# Patient Record
Sex: Female | Born: 1985 | Race: Black or African American | Hispanic: No | Marital: Married
Health system: Southern US, Academic
[De-identification: ages and names within clinical notes are randomized; demographics above are authoritative.]

## PROBLEM LIST (undated history)

## (undated) ENCOUNTER — Encounter

## (undated) ENCOUNTER — Ambulatory Visit: Payer: PRIVATE HEALTH INSURANCE

## (undated) ENCOUNTER — Ambulatory Visit

## (undated) ENCOUNTER — Telehealth: Attending: Rheumatology | Primary: Rheumatology

## (undated) ENCOUNTER — Encounter: Payer: PRIVATE HEALTH INSURANCE | Attending: Dermatology | Primary: Dermatology

## (undated) ENCOUNTER — Encounter: Attending: Internal Medicine | Primary: Internal Medicine

## (undated) ENCOUNTER — Encounter: Attending: Rheumatology | Primary: Rheumatology

## (undated) ENCOUNTER — Encounter: Payer: PRIVATE HEALTH INSURANCE | Attending: Internal Medicine | Primary: Internal Medicine

## (undated) ENCOUNTER — Ambulatory Visit: Payer: PRIVATE HEALTH INSURANCE | Attending: Internal Medicine | Primary: Internal Medicine

## (undated) ENCOUNTER — Ambulatory Visit
Payer: PRIVATE HEALTH INSURANCE | Attending: Student in an Organized Health Care Education/Training Program | Primary: Student in an Organized Health Care Education/Training Program

## (undated) ENCOUNTER — Encounter: Attending: Ambulatory Care | Primary: Ambulatory Care

## (undated) ENCOUNTER — Telehealth

## (undated) ENCOUNTER — Encounter: Payer: PRIVATE HEALTH INSURANCE | Attending: Ophthalmology | Primary: Ophthalmology

## (undated) ENCOUNTER — Ambulatory Visit
Payer: BLUE CROSS/BLUE SHIELD | Attending: Student in an Organized Health Care Education/Training Program | Primary: Student in an Organized Health Care Education/Training Program

## (undated) ENCOUNTER — Encounter
Payer: PRIVATE HEALTH INSURANCE | Attending: Student in an Organized Health Care Education/Training Program | Primary: Student in an Organized Health Care Education/Training Program

## (undated) ENCOUNTER — Telehealth: Attending: Internal Medicine | Primary: Internal Medicine

## (undated) ENCOUNTER — Ambulatory Visit: Attending: Nurse Practitioner | Primary: Nurse Practitioner

## (undated) ENCOUNTER — Ambulatory Visit
Attending: Student in an Organized Health Care Education/Training Program | Primary: Student in an Organized Health Care Education/Training Program

## (undated) ENCOUNTER — Ambulatory Visit: Payer: PRIVATE HEALTH INSURANCE | Attending: Rheumatology | Primary: Rheumatology

## (undated) HISTORY — PX: TUBAL LIGATION: SHX77

---
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
## 2005-04-01 ENCOUNTER — Emergency Department: Payer: Self-pay | Admitting: Emergency Medicine

## 2006-09-30 ENCOUNTER — Emergency Department: Payer: Self-pay | Admitting: Emergency Medicine

## 2010-04-11 ENCOUNTER — Emergency Department: Payer: Self-pay | Admitting: Internal Medicine

## 2010-10-27 ENCOUNTER — Ambulatory Visit: Payer: Self-pay | Admitting: Internal Medicine

## 2011-01-08 ENCOUNTER — Ambulatory Visit: Payer: Self-pay | Admitting: Internal Medicine

## 2013-09-10 ENCOUNTER — Emergency Department: Payer: Self-pay | Admitting: Emergency Medicine

## 2013-09-10 LAB — COMPREHENSIVE METABOLIC PANEL
Albumin: 3.9 g/dL (ref 3.4–5.0)
Anion Gap: 6 — ABNORMAL LOW (ref 7–16)
BUN: 5 mg/dL — ABNORMAL LOW (ref 7–18)
Bilirubin,Total: 0.6 mg/dL (ref 0.2–1.0)
Calcium, Total: 9.2 mg/dL (ref 8.5–10.1)
Chloride: 103 mmol/L (ref 98–107)
Co2: 27 mmol/L (ref 21–32)
EGFR (African American): >60
EGFR (Non-African Amer.): >60
Glucose: 112 mg/dL — ABNORMAL HIGH (ref 65–99)
Osmolality: 270 (ref 275–301)
SGPT (ALT): 18 U/L (ref 12–78)
Sodium: 136 mmol/L (ref 136–145)
Total Protein: 7.8 g/dL (ref 6.4–8.2)

## 2013-09-10 LAB — CBC WITH DIFFERENTIAL/PLATELET
Basophil #: 0 10*3/uL (ref 0.0–0.1)
Basophil %: 0.4 %
HCT: 41.6 % (ref 35.0–47.0)
HGB: 14.1 g/dL (ref 12.0–16.0)
Lymphocyte #: 1.6 10*3/uL (ref 1.0–3.6)
Lymphocyte %: 14.7 %
MCHC: 33.9 g/dL (ref 32.0–36.0)
MCV: 89 fL (ref 80–100)
Neutrophil #: 8.6 10*3/uL — ABNORMAL HIGH (ref 1.4–6.5)
WBC: 11.1 10*3/uL — ABNORMAL HIGH (ref 3.6–11.0)

## 2013-10-15 ENCOUNTER — Emergency Department: Payer: Self-pay | Admitting: Emergency Medicine

## 2013-10-15 LAB — COMPREHENSIVE METABOLIC PANEL
Anion Gap: 14 (ref 7–16)
Bilirubin,Total: 0.5 mg/dL (ref 0.2–1.0)
Calcium, Total: 9.5 mg/dL (ref 8.5–10.1)
Co2: 18 mmol/L — ABNORMAL LOW (ref 21–32)
Creatinine: 0.55 mg/dL — ABNORMAL LOW (ref 0.60–1.30)
EGFR (Non-African Amer.): >60
Glucose: 178 mg/dL — ABNORMAL HIGH (ref 65–99)
Osmolality: 272 (ref 275–301)
Potassium: 3.2 mmol/L — ABNORMAL LOW (ref 3.5–5.1)
SGPT (ALT): 19 U/L (ref 12–78)
Sodium: 135 mmol/L — ABNORMAL LOW (ref 136–145)
Total Protein: 7.5 g/dL (ref 6.4–8.2)

## 2013-10-15 LAB — CBC WITH DIFFERENTIAL/PLATELET
Basophil #: 0.1 10*3/uL (ref 0.0–0.1)
Basophil %: 0.6 %
Eosinophil #: 0 10*3/uL (ref 0.0–0.7)
Eosinophil %: 0.3 %
HCT: 39.8 % (ref 35.0–47.0)
HGB: 13.6 g/dL (ref 12.0–16.0)
Lymphocyte %: 7.4 %
MCHC: 34.1 g/dL (ref 32.0–36.0)
MCV: 88 fL (ref 80–100)
Monocyte #: 0.3 x10 3/mm (ref 0.2–0.9)
Neutrophil #: 13.6 10*3/uL — ABNORMAL HIGH (ref 1.4–6.5)
Neutrophil %: 89.9 %
Platelet: 244 10*3/uL (ref 150–440)
RBC: 4.5 10*6/uL (ref 3.80–5.20)
RDW: 14.9 % — ABNORMAL HIGH (ref 11.5–14.5)
WBC: 15.1 10*3/uL — ABNORMAL HIGH (ref 3.6–11.0)

## 2013-10-15 LAB — URINALYSIS, COMPLETE
Bilirubin,UR: NEGATIVE
Blood: NEGATIVE
Glucose,UR: 150 mg/dL (ref 0–75)
Leukocyte Esterase: NEGATIVE
Nitrite: NEGATIVE
Ph: 6 (ref 4.5–8.0)
Specific Gravity: 1.031 (ref 1.003–1.030)

## 2014-01-07 ENCOUNTER — Emergency Department: Payer: Self-pay | Admitting: Emergency Medicine

## 2014-01-07 LAB — COMPREHENSIVE METABOLIC PANEL
ALT: 18 U/L (ref 12–78)
ANION GAP: 10 (ref 7–16)
Albumin: 3.2 g/dL — ABNORMAL LOW (ref 3.4–5.0)
Alkaline Phosphatase: 89 U/L
BUN: 4 mg/dL — ABNORMAL LOW (ref 7–18)
Bilirubin,Total: 0.4 mg/dL (ref 0.2–1.0)
CALCIUM: 8.9 mg/dL (ref 8.5–10.1)
Chloride: 105 mmol/L (ref 98–107)
Co2: 24 mmol/L (ref 21–32)
Creatinine: 0.67 mg/dL (ref 0.60–1.30)
EGFR (African American): >60
EGFR (Non-African Amer.): >60
Glucose: 132 mg/dL — ABNORMAL HIGH (ref 65–99)
OSMOLALITY: 276 (ref 275–301)
Potassium: 3.2 mmol/L — ABNORMAL LOW (ref 3.5–5.1)
SGOT(AST): 18 U/L (ref 15–37)
SODIUM: 139 mmol/L (ref 136–145)
Total Protein: 7.4 g/dL (ref 6.4–8.2)

## 2014-01-07 LAB — CBC WITH DIFFERENTIAL/PLATELET
Basophil #: 0.1 10*3/uL (ref 0.0–0.1)
Basophil %: 0.6 %
EOS ABS: 0 10*3/uL (ref 0.0–0.7)
Eosinophil %: 0 %
HCT: 36.7 % (ref 35.0–47.0)
HGB: 12.3 g/dL (ref 12.0–16.0)
LYMPHS ABS: 1.5 10*3/uL (ref 1.0–3.6)
LYMPHS PCT: 9.7 %
MCH: 30.4 pg (ref 26.0–34.0)
MCHC: 33.4 g/dL (ref 32.0–36.0)
MCV: 91 fL (ref 80–100)
Monocyte #: 1.1 x10 3/mm — ABNORMAL HIGH (ref 0.2–0.9)
Monocyte %: 7.1 %
NEUTROS ABS: 12.5 10*3/uL — AB (ref 1.4–6.5)
Neutrophil %: 82.6 %
Platelet: 221 10*3/uL (ref 150–440)
RBC: 4.04 10*6/uL (ref 3.80–5.20)
RDW: 15 % — ABNORMAL HIGH (ref 11.5–14.5)
WBC: 15.2 10*3/uL — AB (ref 3.6–11.0)

## 2015-05-06 ENCOUNTER — Encounter: Payer: Self-pay | Admitting: Internal Medicine

## 2015-05-06 DIAGNOSIS — L853 Xerosis cutis: Secondary | ICD-10-CM | POA: Insufficient documentation

## 2015-05-07 ENCOUNTER — Encounter: Payer: Self-pay | Admitting: Internal Medicine

## 2015-05-07 ENCOUNTER — Ambulatory Visit (INDEPENDENT_AMBULATORY_CARE_PROVIDER_SITE_OTHER): Payer: Medicaid Other | Admitting: Internal Medicine

## 2015-05-07 VITALS — BP 110/60 | HR 76 | Ht 67.0 in | Wt 165.4 lb

## 2015-05-07 DIAGNOSIS — N926 Irregular menstruation, unspecified: Secondary | ICD-10-CM | POA: Diagnosis not present

## 2015-05-07 DIAGNOSIS — L853 Xerosis cutis: Secondary | ICD-10-CM | POA: Diagnosis not present

## 2015-05-07 DIAGNOSIS — H109 Unspecified conjunctivitis: Secondary | ICD-10-CM | POA: Diagnosis not present

## 2015-05-07 MED ORDER — NEOMYCIN-POLYMYXIN-DEXAMETH 3.5-10000-0.1 OP SUSP: 2.0000 [drp] | Freq: Three times a day (TID) | OPHTHALMIC | Status: DC

## 2015-05-07 NOTE — Patient Instructions (Signed)

## 2015-05-07 NOTE — Progress Notes (Signed)
Date:  05/07/2015   Name:  Natasha Dominguez   DOB:  08/05/1986   MRN:  [id number]   Chief Complaint: Eye Problem and Menstrual Problem Eye Problem  The left eye is affected. This is a new problem. The current episode started in the past 7 days. The problem occurs constantly. The problem has been unchanged. There was no injury mechanism. The patient is experiencing no pain. Associated symptoms include blurred vision and photophobia. Pertinent negatives include no eye discharge, fever, foreign body sensation or itching. She has tried nothing for the symptoms.   Menstrual problem She has had some irregular periods over the past year.  Her last period was more than 2 months ago.  She has had her tubes tied.  She continues to breast feed her daughter exclusively.  She denies pain, vaginal discharge or fever.   Review of Systems:  Review of Systems  Constitutional: Negative for fever.  HENT: Negative for sore throat and trouble swallowing.   Eyes: Positive for blurred vision and photophobia. Negative for discharge.  Respiratory: Negative for cough and shortness of breath.   Cardiovascular: Negative for chest pain.  Genitourinary: Positive for menstrual problem. Negative for flank pain, vaginal bleeding, vaginal discharge and pelvic pain.  Skin: Negative for itching.    Patient Active Problem List   Diagnosis Date Noted  . 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 Authorizing Provider  ibuprofen (ADVIL,MOTRIN) 600 MG tablet Take 1 tablet by mouth 3 (three) times daily as needed. 06/20/14 06/20/15 Yes Historical Provider, MD    Allergies  Allergen Reactions  . Amoxicillin Hives  . Penicillins   . Citrus Itching  . Nickel Rash    Past Surgical History  Procedure Laterality Date  . Tubal ligation      History  Substance Use Topics  . Smoking status: Never Smoker   . Smokeless tobacco: Not on file  . Alcohol Use: 1.2 oz/week    2  Standard drinks or equivalent per week     Medication list has been reviewed and updated.  Physical Examination:  Physical Exam  Constitutional: She appears well-developed and well-nourished.  Eyes: Left eye exhibits chemosis. Left eye exhibits no discharge.  Excessive tearing bilaterally  Cardiovascular: Normal rate, regular rhythm and S1 normal.   Pulmonary/Chest: Effort normal and breath sounds normal.  Abdominal: Soft. Normal appearance and bowel sounds are normal. She exhibits no distension. There is no hepatosplenomegaly. There is no tenderness. There is no rebound and no guarding.    BP 110/60 mmHg  Pulse 76  Ht 5\' 7"  (1.702 m)  Wt 165 lb 6.4 oz (75.025 kg)  BMI 25.90 kg/m2  Assessment and Plan: 1. Bilateral conjunctivitis Precautions given - neomycin-polymyxin b-dexamethasone (MAXITROL) 3.5-10000-0.1 SUSP; Place 2 drops into both eyes 3 (three) times daily.  Dispense: 5 mL; Refill: 0  2. 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

## 2017-05-16 ENCOUNTER — Ambulatory Visit
Admission: RE | Admit: 2017-05-16 | Discharge: 2017-05-16 | Disposition: A | Discharge status: Home / Self Care | Payer: BC Managed Care – PPO | Attending: Rheumatology | Admitting: Rheumatology

## 2017-05-16 DIAGNOSIS — D869 Sarcoidosis, unspecified: Principal | ICD-10-CM

## 2017-05-16 MED ORDER — METHOTREXATE SODIUM 2.5 MG TABLET
ORAL_TABLET | ORAL | 3 refills | 0.00000 days | Status: CP
Start: 2017-05-16 — End: 2017-11-25

## 2017-06-16 ENCOUNTER — Ambulatory Visit: Admission: RE | Admit: 2017-06-16 | Discharge: 2017-06-16 | Payer: BC Managed Care – PPO

## 2017-06-16 DIAGNOSIS — D863 Sarcoidosis of skin: Secondary | ICD-10-CM

## 2017-06-16 DIAGNOSIS — D869 Sarcoidosis, unspecified: Principal | ICD-10-CM

## 2017-06-16 MED ORDER — CLOBETASOL 0.05 % TOPICAL OINTMENT
Freq: Two times a day (BID) | TOPICAL | 6 refills | 0 days | Status: CP
Start: 2017-06-16 — End: 2018-04-10

## 2017-06-16 MED ORDER — TRIAMCINOLONE ACETONIDE 0.1 % TOPICAL CREAM
Freq: Two times a day (BID) | TOPICAL | 4 refills | 0 days | Status: CP
Start: 2017-06-16 — End: 2019-05-17

## 2017-09-15 ENCOUNTER — Ambulatory Visit: Admission: RE | Admit: 2017-09-15 | Discharge: 2017-09-15 | Payer: BC Managed Care – PPO

## 2017-09-15 DIAGNOSIS — D863 Sarcoidosis of skin: Principal | ICD-10-CM

## 2017-11-14 ENCOUNTER — Encounter: Admit: 2017-11-14 | Discharge: 2017-11-15 | Payer: PRIVATE HEALTH INSURANCE

## 2017-11-14 DIAGNOSIS — D869 Sarcoidosis, unspecified: Principal | ICD-10-CM

## 2017-11-24 ENCOUNTER — Ambulatory Visit: Admit: 2017-11-24 | Discharge: 2017-11-24 | Payer: PRIVATE HEALTH INSURANCE

## 2017-11-24 ENCOUNTER — Encounter
Admit: 2017-11-24 | Discharge: 2017-11-24 | Payer: PRIVATE HEALTH INSURANCE | Attending: Ophthalmology | Primary: Ophthalmology

## 2017-11-24 DIAGNOSIS — D869 Sarcoidosis, unspecified: Principal | ICD-10-CM

## 2017-11-24 DIAGNOSIS — H44113 Panuveitis, bilateral: Principal | ICD-10-CM

## 2017-11-25 MED ORDER — METHOTREXATE SODIUM 2.5 MG TABLET
ORAL_TABLET | ORAL | 3 refills | 0 days | Status: CP
Start: 2017-11-25 — End: 2018-04-10

## 2017-12-22 ENCOUNTER — Encounter: Admit: 2017-12-22 | Discharge: 2017-12-23 | Payer: PRIVATE HEALTH INSURANCE

## 2017-12-22 DIAGNOSIS — D860 Sarcoidosis of lung: Principal | ICD-10-CM

## 2018-01-02 ENCOUNTER — Ambulatory Visit: Admit: 2018-01-02 | Discharge: 2018-01-03 | Payer: PRIVATE HEALTH INSURANCE

## 2018-01-02 DIAGNOSIS — D863 Sarcoidosis of skin: Secondary | ICD-10-CM

## 2018-01-02 DIAGNOSIS — L739 Follicular disorder, unspecified: Principal | ICD-10-CM

## 2018-01-02 MED ORDER — CLINDAMYCIN 1 % LOTION
Freq: Every morning | TOPICAL | 5 refills | 0 days | Status: CP
Start: 2018-01-02 — End: 2019-05-18

## 2018-02-01 ENCOUNTER — Encounter: Admit: 2018-02-01 | Discharge: 2018-02-02 | Payer: PRIVATE HEALTH INSURANCE

## 2018-02-01 DIAGNOSIS — D869 Sarcoidosis, unspecified: Secondary | ICD-10-CM

## 2018-02-01 DIAGNOSIS — L910 Hypertrophic scar: Principal | ICD-10-CM

## 2018-02-01 DIAGNOSIS — L739 Follicular disorder, unspecified: Secondary | ICD-10-CM

## 2018-04-10 ENCOUNTER — Ambulatory Visit
Admit: 2018-04-10 | Discharge: 2018-04-10 | Payer: PRIVATE HEALTH INSURANCE | Attending: Internal Medicine | Primary: Internal Medicine

## 2018-04-10 DIAGNOSIS — D869 Sarcoidosis, unspecified: Principal | ICD-10-CM

## 2018-04-10 DIAGNOSIS — D863 Sarcoidosis of skin: Secondary | ICD-10-CM

## 2018-04-10 DIAGNOSIS — R238 Other skin changes: Secondary | ICD-10-CM

## 2018-04-10 MED ORDER — METHOTREXATE SODIUM 2.5 MG TABLET
ORAL_TABLET | ORAL | 6 refills | 0.00000 days | Status: CP
Start: 2018-04-10 — End: 2019-01-03

## 2018-04-10 MED ORDER — CLOBETASOL 0.05 % TOPICAL OINTMENT
Freq: Two times a day (BID) | TOPICAL | 6 refills | 0.00000 days | Status: CP
Start: 2018-04-10 — End: 2019-04-10

## 2018-06-21 ENCOUNTER — Encounter: Admit: 2018-06-21 | Discharge: 2018-06-22 | Payer: PRIVATE HEALTH INSURANCE

## 2018-06-21 DIAGNOSIS — L923 Foreign body granuloma of the skin and subcutaneous tissue: Secondary | ICD-10-CM

## 2018-06-21 DIAGNOSIS — L910 Hypertrophic scar: Principal | ICD-10-CM

## 2018-10-02 ENCOUNTER — Encounter
Admit: 2018-10-02 | Discharge: 2018-10-03 | Payer: PRIVATE HEALTH INSURANCE | Attending: Internal Medicine | Primary: Internal Medicine

## 2018-10-02 DIAGNOSIS — D863 Sarcoidosis of skin: Secondary | ICD-10-CM

## 2018-10-02 DIAGNOSIS — D869 Sarcoidosis, unspecified: Principal | ICD-10-CM

## 2018-11-14 ENCOUNTER — Encounter
Admit: 2018-11-14 | Discharge: 2018-11-15 | Payer: PRIVATE HEALTH INSURANCE | Attending: Dermatology | Primary: Dermatology

## 2018-11-14 DIAGNOSIS — D8689 Sarcoidosis of other sites: Secondary | ICD-10-CM

## 2018-11-14 DIAGNOSIS — R21 Rash and other nonspecific skin eruption: Principal | ICD-10-CM

## 2018-11-22 ENCOUNTER — Encounter: Admit: 2018-11-22 | Discharge: 2018-11-23 | Payer: PRIVATE HEALTH INSURANCE

## 2018-11-22 DIAGNOSIS — D869 Sarcoidosis, unspecified: Principal | ICD-10-CM

## 2019-01-03 DIAGNOSIS — D869 Sarcoidosis, unspecified: Principal | ICD-10-CM

## 2019-01-03 DIAGNOSIS — Z79899 Other long term (current) drug therapy: Principal | ICD-10-CM

## 2019-01-03 MED ORDER — METHOTREXATE SODIUM 2.5 MG TABLET
ORAL_TABLET | ORAL | 0 refills | 0.00000 days | Status: CP
Start: 2019-01-03 — End: 2019-03-06

## 2019-03-06 MED ORDER — METHOTREXATE SODIUM 2.5 MG TABLET
ORAL_TABLET | ORAL | 0 refills | 0.00000 days | Status: CP
Start: 2019-03-06 — End: 2019-03-21

## 2019-03-20 ENCOUNTER — Non-Acute Institutional Stay: Admit: 2019-03-20 | Discharge: 2019-03-21 | Payer: PRIVATE HEALTH INSURANCE

## 2019-03-20 DIAGNOSIS — Z79899 Other long term (current) drug therapy: Principal | ICD-10-CM

## 2019-03-21 MED ORDER — METHOTREXATE SODIUM 2.5 MG TABLET
ORAL_TABLET | ORAL | 1 refills | 0 days | Status: CP
Start: 2019-03-21 — End: ?

## 2019-05-18 ENCOUNTER — Encounter
Admit: 2019-05-18 | Discharge: 2019-05-19 | Payer: PRIVATE HEALTH INSURANCE | Attending: Internal Medicine | Primary: Internal Medicine

## 2019-05-18 DIAGNOSIS — D863 Sarcoidosis of skin: Principal | ICD-10-CM

## 2019-05-18 MED ORDER — TRIAMCINOLONE ACETONIDE 0.1 % TOPICAL CREAM
Freq: Two times a day (BID) | TOPICAL | 4 refills | 0.00000 days | Status: CP
Start: 2019-05-18 — End: ?

## 2019-05-18 MED ORDER — CLOBETASOL 0.05 % TOPICAL OINTMENT
Freq: Two times a day (BID) | TOPICAL | 3 refills | 0 days | Status: CP
Start: 2019-05-18 — End: 2020-05-17

## 2019-06-04 ENCOUNTER — Encounter
Admit: 2019-06-04 | Discharge: 2019-06-05 | Payer: PRIVATE HEALTH INSURANCE | Attending: Rheumatology | Primary: Rheumatology

## 2019-06-04 DIAGNOSIS — D869 Sarcoidosis, unspecified: Principal | ICD-10-CM

## 2019-10-30 ENCOUNTER — Encounter
Admit: 2019-10-30 | Discharge: 2019-10-31 | Payer: PRIVATE HEALTH INSURANCE | Attending: Dermatology | Primary: Dermatology

## 2019-10-30 DIAGNOSIS — D869 Sarcoidosis, unspecified: Principal | ICD-10-CM

## 2019-10-30 MED ORDER — HYDROXYCHLOROQUINE 200 MG TABLET
ORAL_TABLET | Freq: Two times a day (BID) | ORAL | 5 refills | 30 days | Status: CP
Start: 2019-10-30 — End: ?

## 2019-12-24 ENCOUNTER — Other Ambulatory Visit: Payer: Self-pay | Admitting: Nurse Practitioner

## 2019-12-24 ENCOUNTER — Encounter: Admit: 2019-12-24 | Discharge: 2019-12-25 | Payer: PRIVATE HEALTH INSURANCE

## 2019-12-24 ENCOUNTER — Ambulatory Visit
Admit: 2019-12-24 | Discharge: 2019-12-25 | Payer: PRIVATE HEALTH INSURANCE | Attending: Rheumatology | Primary: Rheumatology

## 2019-12-24 DIAGNOSIS — N6320 Unspecified lump in the left breast, unspecified quadrant: Secondary | ICD-10-CM

## 2019-12-24 DIAGNOSIS — J45909 Unspecified asthma, uncomplicated: Principal | ICD-10-CM

## 2019-12-24 DIAGNOSIS — M1990 Unspecified osteoarthritis, unspecified site: Principal | ICD-10-CM

## 2019-12-24 DIAGNOSIS — D869 Sarcoidosis, unspecified: Principal | ICD-10-CM

## 2019-12-24 MED ORDER — LEUCOVORIN CALCIUM 5 MG TABLET
ORAL_TABLET | Freq: Every day | ORAL | 2 refills | 30.00000 days | Status: CP
Start: 2019-12-24 — End: 2020-03-23

## 2019-12-24 MED ORDER — METHOTREXATE SODIUM 2.5 MG TABLET
ORAL_TABLET | ORAL | 1 refills | 84 days | Status: CP
Start: 2019-12-24 — End: ?

## 2019-12-24 MED ORDER — ALBUTEROL SULFATE HFA 90 MCG/ACTUATION INHALER T-HOME
Freq: Four times a day (QID) | RESPIRATORY_TRACT | 5 refills | 0 days | Status: CP | PRN
Start: 2019-12-24 — End: 2020-12-23

## 2020-01-03 ENCOUNTER — Ambulatory Visit
Admission: RE | Admit: 2020-01-03 | Discharge: 2020-01-03 | Disposition: A | Discharge status: Home / Self Care | Payer: BC Managed Care – PPO | Source: Ambulatory Visit | Attending: Nurse Practitioner | Admitting: Nurse Practitioner

## 2020-01-03 DIAGNOSIS — N6320 Unspecified lump in the left breast, unspecified quadrant: Secondary | ICD-10-CM | POA: Insufficient documentation

## 2020-01-08 ENCOUNTER — Other Ambulatory Visit: Payer: Self-pay | Admitting: Nurse Practitioner

## 2020-01-08 DIAGNOSIS — N6320 Unspecified lump in the left breast, unspecified quadrant: Secondary | ICD-10-CM

## 2020-01-08 DIAGNOSIS — R928 Other abnormal and inconclusive findings on diagnostic imaging of breast: Secondary | ICD-10-CM

## 2020-01-11 ENCOUNTER — Ambulatory Visit
Admission: RE | Admit: 2020-01-11 | Discharge: 2020-01-11 | Disposition: A | Discharge status: Home / Self Care | Payer: BC Managed Care – PPO | Source: Ambulatory Visit | Attending: Nurse Practitioner | Admitting: Nurse Practitioner

## 2020-01-11 DIAGNOSIS — R928 Other abnormal and inconclusive findings on diagnostic imaging of breast: Secondary | ICD-10-CM

## 2020-01-11 DIAGNOSIS — N6320 Unspecified lump in the left breast, unspecified quadrant: Secondary | ICD-10-CM

## 2020-01-16 LAB — SURGICAL PATHOLOGY

## 2020-02-26 ENCOUNTER — Encounter: Admit: 2020-02-26 | Discharge: 2020-02-27 | Payer: PRIVATE HEALTH INSURANCE

## 2020-05-26 ENCOUNTER — Ambulatory Visit
Admit: 2020-05-26 | Discharge: 2020-05-26 | Payer: PRIVATE HEALTH INSURANCE | Attending: Rheumatology | Primary: Rheumatology

## 2020-05-26 DIAGNOSIS — M25569 Pain in unspecified knee: Principal | ICD-10-CM

## 2020-05-26 DIAGNOSIS — E559 Vitamin D deficiency, unspecified: Principal | ICD-10-CM

## 2020-05-26 DIAGNOSIS — D869 Sarcoidosis, unspecified: Principal | ICD-10-CM

## 2020-05-26 DIAGNOSIS — Z79899 Other long term (current) drug therapy: Principal | ICD-10-CM

## 2020-05-26 MED ORDER — DICLOFENAC 1 % TOPICAL GEL
Freq: Four times a day (QID) | TOPICAL | 3 refills | 13 days | Status: CP | PRN
Start: 2020-05-26 — End: 2020-07-15

## 2020-05-26 MED ORDER — ADALIMUMAB PEN CITRATE FREE 40 MG/0.4 ML
SUBCUTANEOUS | 3 refills | 28 days | Status: CP
Start: 2020-05-26 — End: ?
  Filled 2020-06-17: qty 2, 28d supply, fill #0

## 2020-05-26 MED ORDER — EMPTY CONTAINER
2 refills | 0 days
Start: 2020-05-26 — End: ?

## 2020-05-29 DIAGNOSIS — E559 Vitamin D deficiency, unspecified: Principal | ICD-10-CM

## 2020-05-29 MED ORDER — CHOLECALCIFEROL (VITAMIN D3) 25 MCG (1,000 UNIT) CAPSULE
ORAL_CAPSULE | Freq: Every day | ORAL | 11 refills | 30 days | Status: CP
Start: 2020-05-29 — End: 2021-05-29

## 2020-05-30 DIAGNOSIS — D869 Sarcoidosis, unspecified: Principal | ICD-10-CM

## 2020-06-04 NOTE — Unmapped (Signed)
[facility] Specialty Medication Onboarding    Specialty Medication: Humira (CF) pens 40mg  every 2 weeks  Prior Authorization: Approved   Financial Assistance: No - copay  <$25  Final Copay/Day Supply: $12 / 28 days    Insurance Restrictions: Yes - max 1 month supply     Notes to Pharmacist:     The triage team has completed the benefits investigation and has determined that the patient is able to fill this medication at [facility]. Please contact the patient to complete the onboarding or follow up with the prescribing physician as needed.

## 2020-06-05 DIAGNOSIS — M25569 Pain in unspecified knee: Principal | ICD-10-CM

## 2020-06-06 NOTE — Unmapped (Signed)
[facility]   Patient Onboarding/Medication Counseling    Ann Riley is a 34 y.o. female with sarcoidosis who I am counseling today on initiation of therapy.  I am speaking to the patient.    Was a Nurse, learning disability used for this call? No    Verified patient's date of birth / HIPAA.    Specialty medication(s) to be sent: Inflammatory Disorders: Humira      Non-specialty medications/supplies to be sent: N/A      Medications not needed at this time: N/A         Humira (adalimumab)    Medication & Administration     Dosage: Sarcoidosis: Inject 40mg  under the skin every 14 days    Lab tests required prior to treatment initiation:  ??? Tuberculosis: Tuberculosis screening resulted in a non-reactive Quantiferon TB Gold assay.  ??? Hepatitis B: Hepatitis B serology studies are complete and non-reactive.    Administration:     Prefilled auto-injector pen  1. Gather all supplies needed for injection on a clean, flat working surface: medication pen removed from packaging, alcohol swab, sharps container, etc.  2. Look at the medication label - look for correct medication, correct dose, and check the expiration date  3. Look at the medication - the liquid visible in the window on the side of the pen device should appear clear and colorless  4. Lay the auto-injector pen on a flat surface and allow it to warm up to room temperature for at least 30-45 minutes  5. Select injection site - you can use the front of your thigh or your belly (but not the area 2 inches around your belly button); if someone else is giving you the injection you can also use your upper arm in the skin covering your triceps muscle  6. Prepare injection site - wash your hands and clean the skin at the injection site with an alcohol swab and let it air dry, do not touch the injection site again before the injection  7. Pull the 2 safety caps straight off - gray/white to uncover the needle cover and the plum cap to uncover the plum activator button, do not remove until immediately prior to injection and do not touch the white needle cover  8. Gently squeeze the area of cleaned skin and hold it firmly to create a firm surface at the selected injection site  9. Put the white needle cover against your skin at the injection site at a 90 degree angle, hold the pen such that you can see the clear medication window  10. Press down and hold the pen firmly against your skin, press the plum activator button to initiate the injection, there will be a click when the injection starts  11. Continue to hold the pen firmly against your skin for about 10-15 seconds - the window will start to turn solid yellow  12. To verify the injection is complete after 10-15 seconds, look and ensure the window is solid yellow and then pull the pen away from your skin  13. Dispose of the used auto-injector pen immediately in your sharps disposal container the needle will be covered automatically  14. If you see any blood at the injection site, press a cotton ball or gauze on the site and maintain pressure until the bleeding stops, do not rub the injection site    Adherence/Missed dose instructions:  If your injection is given more than 3 days after your scheduled injection date ??? consult your  pharmacist for additional instructions on how to adjust your dosing schedule.    Goals of Therapy     - Reduce the frequency and severity of new lesions  - Minimize pain and suppuration  - Prevent disease progression and limit scarring  - Maintenance of effective psychosocial functioning    Side Effects & Monitoring Parameters     ??? Injection site reaction (redness, irritation, inflammation localized to the site of administration)  ??? Signs of a common cold ??? minor sore throat, runny or stuffy nose, etc.  ??? Upset stomach  ??? Headache    The following side effects should be reported to the provider:  ??? Signs of a hypersensitivity reaction ??? rash; hives; itching; red, swollen, blistered, or peeling skin; wheezing; tightness in the chest or throat; difficulty breathing, swallowing, or talking; swelling of the mouth, face, lips, tongue, or throat; etc.  ??? Reduced immune function ??? report signs of infection such as fever; chills; body aches; very bad sore throat; ear or sinus pain; cough; more sputum or change in color of sputum; pain with passing urine; wound that will not heal, etc.  Also at a slightly higher risk of some malignancies (mainly skin and blood cancers) due to this reduced immune function.  o In the case of signs of infection ??? the patient should hold the next dose of Humira?? and call your primary care provider to ensure adequate medical care.  Treatment may be resumed when infection is treated and patient is asymptomatic.  ??? Changes in skin ??? a new growth or lump that forms; changes in shape, size, or color of a previous mole or marking  ??? Signs of unexplained bruising or bleeding ??? throwing up blood or emesis that looks like coffee grounds; black, tarry, or bloody stool; etc.  ??? Signs of new or worsening heart failure ??? shortness of breath; sudden weight gain; heartbeat that is not normal; swelling in the arms or legs that is new or worse      Contraindications, Warnings, & Precautions     ??? Have your bloodwork checked as you have been told by your prescriber  ??? Talk with your doctor if you are pregnant, planning to become pregnant, or breastfeeding  ??? Discuss the possible need for holding your dose(s) of Humira?? when a planned procedure is scheduled with the prescriber as it may delay healing/recovery timeline       Drug/Food Interactions     ??? Medication list reviewed in Epic. The patient was instructed to inform the care team before taking any new medications or supplements. No drug interactions identified.   ??? Talk with you prescriber or pharmacist before receiving any live vaccinations while taking this medication and after you stop taking it    Storage, Handling Precautions, & Disposal ??? Store this medication in the refrigerator.  Do not freeze  ??? If needed, you may store at room temperature for up to 14 days  ??? Store in original packaging, protected from light  ??? Do not shake  ??? Dispose of used syringes/pens in a sharps disposal container            Current Medications (including OTC/herbals), Comorbidities and Allergies     Current Outpatient Medications   Medication Sig Dispense Refill   ??? ADALIMUMAB PEN CITRATE FREE 40 MG/0.4 ML Inject the contents of 1 pen (40 mg total) under the skin every fourteen (14) days. 2 each 3   ??? albuterol (PROVENTIL HFA;VENTOLIN HFA) 90 mcg/actuation inhaler  Inhale 2 puffs every six (6) hours as needed for wheezing or shortness of breath. 1 each 5   ??? cholecalciferol, vitamin D3-25 mcg, 1,000 unit,, 25 mcg (1,000 unit) capsule Take 2 capsules (50 mcg total) by mouth daily. 60 capsule 11   ??? diclofenac sodium (VOLTAREN) 1 % gel Apply 2 g topically 4 (four) times a day as needed for arthritis or pain. 100 g 3   ??? hydrOXYchloroQUINE (PLAQUENIL) 200 mg tablet Take 1 tablet (200 mg total) by mouth Two (2) times a day. 60 tablet 5   ??? methotrexate 2.5 MG tablet Take 8 tablets (20 mg total) by mouth once a week. 96 tablet 1   ??? triamcinolone (KENALOG) 0.1 % cream Apply topically Two (2) times a day. To AA on arms, back, etc. 80 g 4     Current Facility-Administered Medications   Medication Dose Route Frequency Provider Last Rate Last Admin   ??? triamcinolone acetonide (KENALOG) injection 10 mg  10 mg Intralesional Once [employer], [place]           Allergies   Allergen Reactions   ??? Amoxicillin Hives   ??? Leucovorin Rash   ??? Leucovorin Analogues      rash   ??? Penicillins    ??? Citrus And Derivatives Itching   ??? Nickel Rash   ??? Orange Itching   ??? Penicillin Rash   ??? Tums [Calcium Carbonate] Rash       Patient Active Problem List   Diagnosis   ??? Hyperemesis gravidarum   ??? Low weight gain in pregnancy   ??? Threatened labor at term   ??? History of female sterilization ??? Sarcoidosis       Reviewed and up to date in Epic.    Appropriateness of Therapy     Is medication and dose appropriate based on diagnosis? Yes    Prescription has been clinically reviewed: Yes    Baseline Quality of Life Assessment      Rheumatology:   Quality of Life    On a scale of 1 ??? 10 with 1 representing not at all and 10 representing completely ??? how has your rheumatologic condition affected your:  Daily pain level?: decline to answer  Ability to complete your regular daily tasks (prepare meals, get dressed, etc.)?: decline to answer  Ability to participate in social or family activities?: decline to answer         Financial Information     Medication Assistance provided: Prior Authorization    Anticipated copay of $12/ 28 days reviewed with patient. Verified delivery address.    Delivery Information     Scheduled delivery date: 06/17/2020    Expected start date: 06/17/2020    Medication will be delivered via Same Day Courier to the prescription address in [facility].  This shipment will not require a signature.      Explained the services we provide at [facility] and that each month we would call to set up refills.  Stressed importance of returning phone calls so that we could ensure they receive their medications in time each month.  Informed patient that we should be setting up refills 7-10 days prior to when they will run out of medication.  A pharmacist will reach out to perform a clinical assessment periodically.  Informed patient that a welcome packet and a drug information handout will be sent.      Patient verbalized understanding of the above information as well  as how to contact the pharmacy at [phone] option 4 with any questions/concerns.  The pharmacy is open Monday through Friday 8:30am-4:30pm.  A pharmacist is available 24/7 via pager to answer any clinical questions they may have.    Patient Specific Needs     - Does the patient have any physical, cognitive, or cultural barriers? No    - Patient prefers to have medications discussed with  Patient     - Is the patient or caregiver able to read and understand education materials at a high school level or above? Yes    - Patient's primary language is  English     - Is the patient high risk? No    - Does the patient require a Care Management Plan? No     - Does the patient require physician intervention or other additional services (i.e. nutrition, smoking cessation, social work)? No      Lupita Shutter, PharmD  [facility] Specialty Pharmacist

## 2020-06-16 DIAGNOSIS — D869 Sarcoidosis, unspecified: Principal | ICD-10-CM

## 2020-06-16 NOTE — Unmapped (Signed)
[facility] Pharmacist Intervention    Type of intervention: Vaccination advice    Medication: Humira and methotrexate    Problem: Ms. Lansdale is taking multiple immunosuppressive medications and the CDC (Centers for Disease Control and Prevention) recently recommended that rheumatology patients being treated with medications that lower the immune system should receive a third dose of the Pfizer or Moderna COVID-19 vaccinations. This recommendation does not apply to those who received the [place] and [place] vaccine currently.    Intervention: I advised Ms. Scheuring that she is eligible for a supplemental third dose of vaccine and encouraged her to get this dose as soon as she could.  [place] is in the providing these doses and patients can schedule through the yourshot.org website.  Patients can also receive a booster at any commercial pharmacy that is providing vaccines ([facility], [facility] etc). The 3rd dose should be given at least 28 days after your 2nd dose and you should receive the same brand that you received for your first 2 doses.     Follow up needed: none at this time    Approximate time spent: 10 minutes    Karene Fry Omie Ferger   [facility] Specialty Pharmacist

## 2020-06-17 MED FILL — EMPTY CONTAINER: 120 days supply | Qty: 1 | Fill #0

## 2020-06-17 MED FILL — EMPTY CONTAINER: 120 days supply | Qty: 1 | Fill #0 | Status: AC

## 2020-06-17 MED FILL — HUMIRA PEN CITRATE FREE 40 MG/0.4 ML: 28 days supply | Qty: 2 | Fill #0 | Status: AC

## 2020-06-25 DIAGNOSIS — R110 Nausea: Principal | ICD-10-CM

## 2020-06-25 MED ORDER — ONDANSETRON HCL 4 MG TABLET
ORAL_TABLET | Freq: Two times a day (BID) | ORAL | 0 refills | 30.00000 days | Status: CP | PRN
Start: 2020-06-25 — End: 2021-06-25

## 2020-06-25 NOTE — Unmapped (Signed)
Reason for call: please call pt in reference to her FMLA  paperwork      Last ov: 05/26/2020  Next ov: 12/01/2020

## 2020-06-25 NOTE — Unmapped (Signed)
I returned Ann Riley's call regarding symptoms after taking her first Humira injection. She took the first dose last Friday (9/3). Several hours later she developed nausea, headache and diarrhea. The intermittent nausea and diarrhea have persisted, although she is no longer vomiting. She is not currently taking anything for her symptoms. No fevers. We will trial PRN zofran and tylenol for these symptoms now. She can also try imodium as needed if necessary for diarrhea. I advised her to take a dose of zofran and tylenol prior to her next injection. She reports she is still having issues with her employer regarding answers provided on FMLA, will review and update as needed as this is the third time we have edited the paperwork. Will have this faxed back to employer. Pt expressed understanding and agreement with the plan.    Oletha Cruel, MD

## 2020-06-25 NOTE — Unmapped (Signed)
Reason for call:     Pt took first Humira injection on Friday and has had side effects of nausea, diarrhea and headaches. She would like to discuss what to do.    Last ov: 05/26/2020  Next ov: 12/01/2020

## 2020-06-25 NOTE — Unmapped (Signed)
Called and spoke with patient.   Sx's started 2-3 hours after her first injection.    Patient has not tried anything except staying hydrated.    Please advise on next steps.    Thanks!    Tiff

## 2020-06-26 NOTE — Unmapped (Signed)
Good Morning, I saw that you had spoken with her , this call was about her FMLA, , I'm not sure if she had spoken with you about this.  Thanks [employer]

## 2020-06-30 NOTE — Unmapped (Signed)
Reason for call: Hi Dr .Dreama Saa,     MS. Baltazar called in requesting if you could please call her regarding her FMLA paperwork? Pt stated that she had called Friday and hasnt heard back from you.     Thanks      Last ov: 05/26/2020  Next ov: 12/01/2020

## 2020-07-09 NOTE — Unmapped (Signed)
[facility] Clinical Assessment & Refill Coordination Note    Ann Riley, DOB: 1986/06/20  Phone: [phone] (home)     All above HIPAA information was verified with patient.     Was a Nurse, learning disability used for this call? No    Specialty Medication(s):   Inflammatory Disorders: Humira     Current Outpatient Medications   Medication Sig Dispense Refill   ??? ADALIMUMAB PEN CITRATE FREE 40 MG/0.4 ML Inject the contents of 1 pen (40mg ) under the skin every 14 days 2 each 3   ??? albuterol (PROVENTIL HFA;VENTOLIN HFA) 90 mcg/actuation inhaler Inhale 2 puffs every six (6) hours as needed for wheezing or shortness of breath. 1 each 5   ??? cholecalciferol, vitamin D3-25 mcg, 1,000 unit,, 25 mcg (1,000 unit) capsule Take 2 capsules (50 mcg total) by mouth daily. 60 capsule 11   ??? diclofenac sodium (VOLTAREN) 1 % gel Apply 2 g topically 4 (four) times a day as needed for arthritis or pain. 100 g 3   ??? empty container Misc Use as directed 1 each 2   ??? hydrOXYchloroQUINE (PLAQUENIL) 200 mg tablet Take 1 tablet (200 mg total) by mouth Two (2) times a day. 60 tablet 5   ??? methotrexate 2.5 MG tablet Take 8 tablets (20 mg total) by mouth once a week. 96 tablet 1   ??? ondansetron (ZOFRAN) 4 MG tablet Take 1 tablet (4 mg total) by mouth every twelve (12) hours as needed for nausea. 60 tablet 0   ??? triamcinolone (KENALOG) 0.1 % cream Apply topically Two (2) times a day. To AA on arms, back, etc. 80 g 4     Current Facility-Administered Medications   Medication Dose Route Frequency Provider Last Rate Last Admin   ??? triamcinolone acetonide (KENALOG) injection 10 mg  10 mg Intralesional Once [employer], [place]            Changes to medications: Cade reports no changes at this time.    Allergies   Allergen Reactions   ??? Amoxicillin Hives   ??? Leucovorin Rash   ??? Leucovorin Analogues      rash   ??? Penicillins    ??? Citrus And Derivatives Itching   ??? Nickel Rash   ??? Orange Itching   ??? Penicillin Rash   ??? Tums [Calcium Carbonate] Rash       Changes to allergies: No    SPECIALTY MEDICATION ADHERENCE     Humira 40mg /0.76ml: 0 days of medicine on hand     Medication Adherence    Patient reported X missed doses in the last month: 0  Specialty Medication: Humira 40mg /0.52ml          Specialty medication(s) dose(s) confirmed: Regimen is correct and unchanged.     Are there any concerns with adherence? No    Adherence counseling provided? Not needed    CLINICAL MANAGEMENT AND INTERVENTION      Clinical Benefit Assessment:    Do you feel the medicine is effective or helping your condition? Yes    Clinical Benefit counseling provided? Not needed    Adverse Effects Assessment:    Are you experiencing any side effects? Yes, patient reports experiencing nausea and headache immediately after injections. Side effect counseling provided: none at this time due to clinic managing these issues currently; [name] reports her 2nd dose was much more tolerable after utilizing her ondansetron    Are you experiencing difficulty administering your medicine? No  Quality of Life Assessment:    Rheumatology:   Quality of Life    On a scale of 1 ??? 10 with 1 representing not at all and 10 representing completely ??? how has your rheumatologic condition affected your:  Daily pain level?: decline to answer  Ability to complete your regular daily tasks (prepare meals, get dressed, etc.)?: decline to answer  Ability to participate in social or family activities?: decline to answer         Have you discussed this with your provider? Not needed    Therapy Appropriateness:    Is therapy appropriate? Yes, therapy is appropriate and should be continued    DISEASE/MEDICATION-SPECIFIC INFORMATION      For patients on injectable medications: Patient currently has 0 doses left.  Next injection is scheduled for 07/22/2020.    PATIENT SPECIFIC NEEDS     - Does the patient have any physical, cognitive, or cultural barriers? No    - Is the patient high risk? No    - Does the patient require a Care Management Plan? No     - Does the patient require physician intervention or other additional services (i.e. nutrition, smoking cessation, social work)? No      SHIPPING     Specialty Medication(s) to be Shipped:   Inflammatory Disorders: Humira 40mg /0.40ml    Other medication(s) to be shipped: No additional medications requested for fill at this time     Changes to insurance: No    Delivery Scheduled: Yes, Expected medication delivery date: 07/18/2020.     Medication will be delivered via Same Day Courier to the confirmed prescription address in [facility].    The patient will receive a drug information handout for each medication shipped and additional FDA Medication Guides as required.  Verified that patient has previously received a Conservation officer, historic buildings.    All of the patient's questions and concerns have been addressed.    Karene Fry Kalina Morabito   [facility] [employer] [facility] Specialty Pharmacist

## 2020-07-18 MED FILL — HUMIRA PEN CITRATE FREE 40 MG/0.4 ML: SUBCUTANEOUS | 28 days supply | Qty: 2 | Fill #1

## 2020-07-18 MED FILL — HUMIRA PEN CITRATE FREE 40 MG/0.4 ML: 28 days supply | Qty: 2 | Fill #1 | Status: AC

## 2020-08-11 NOTE — Unmapped (Signed)
[facility] Refill Coordination Note    Specialty Medication(s) to be Shipped:   Inflammatory Disorders: Humira    Other medication(s) to be shipped: No additional medications requested for fill at this time     Ann Riley, DOB: 12-09-1985  Phone: [phone] (home)       All above HIPAA information was verified with patient.     Was a Nurse, learning disability used for this call? No    Completed refill call assessment today to schedule patient's medication shipment from the [facility] [phone]).       Specialty medication(s) and dose(s) confirmed: Regimen is correct and unchanged.   Changes to medications: Ann Riley reports no changes at this time.  Changes to insurance: No  Questions for the pharmacist: No    Confirmed patient received Welcome Packet with first shipment. The patient will receive a drug information handout for each medication shipped and additional FDA Medication Guides as required.       DISEASE/MEDICATION-SPECIFIC INFORMATION        For patients on injectable medications: Patient currently has 0 doses left.  Next injection is scheduled for 08/19/20.    SPECIALTY MEDICATION ADHERENCE     Medication Adherence    Patient reported X missed doses in the last month: 0  Specialty Medication: Humira 40mg /0.57ml  Patient is on additional specialty medications: No                Humira 40mg  0.38ml: 0 days of medicine on hand         SHIPPING     Shipping address confirmed in Epic.     Delivery Scheduled: Yes, Expected medication delivery date: 08/14/20.     Medication will be delivered via Same Day Courier to the prescription address in Epic WAM.    Nancy Nordmann [facility] Specialty Technician

## 2020-08-12 IMAGING — MG DIGITAL DIAGNOSTIC BILAT W/ TOMO W/ CAD
8 of 14 series · 8 of 40 positions shown · non-contrast
Comparison: Previous exam(s).

CLINICAL DATA: Palpable lump left breast

EXAM:
DIGITAL DIAGNOSTIC bilateral MAMMOGRAM WITH CAD AND TOMO
ULTRASOUND left BREAST

[R CC synth-2D]
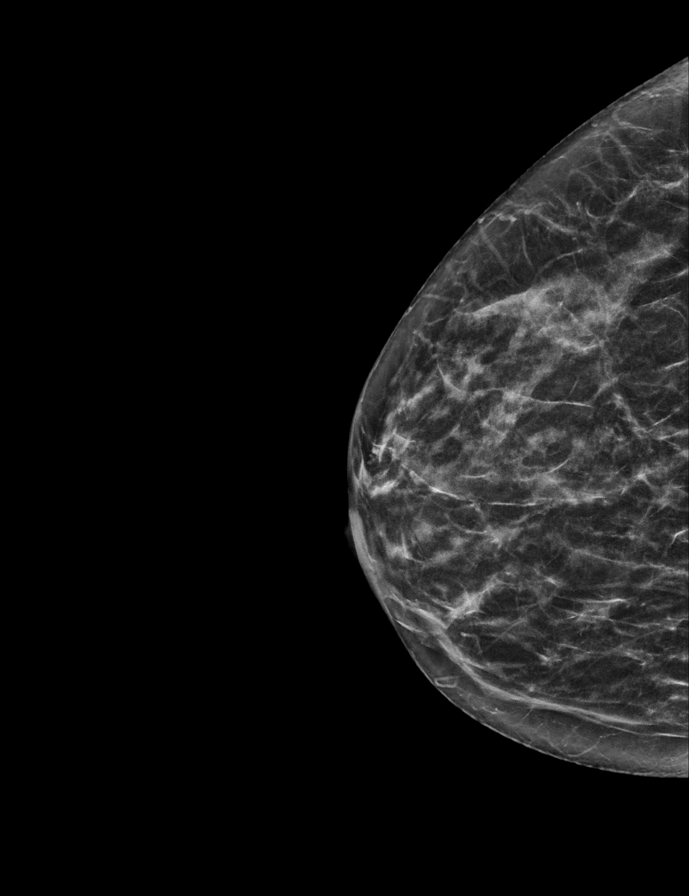

[L CC synth-2D (1 of 2)]
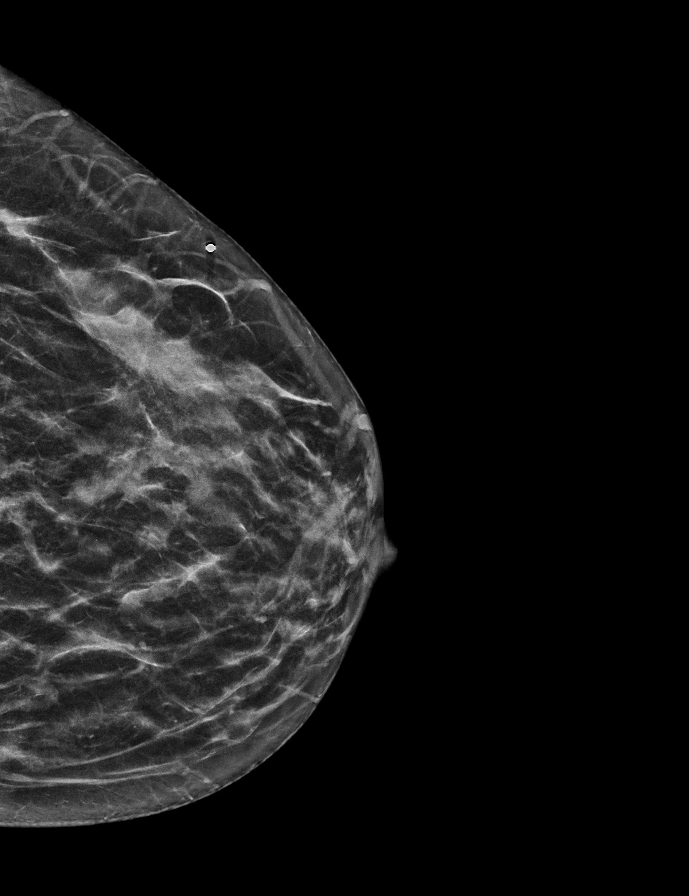

[L MLO synth-2D (1 of 2)]
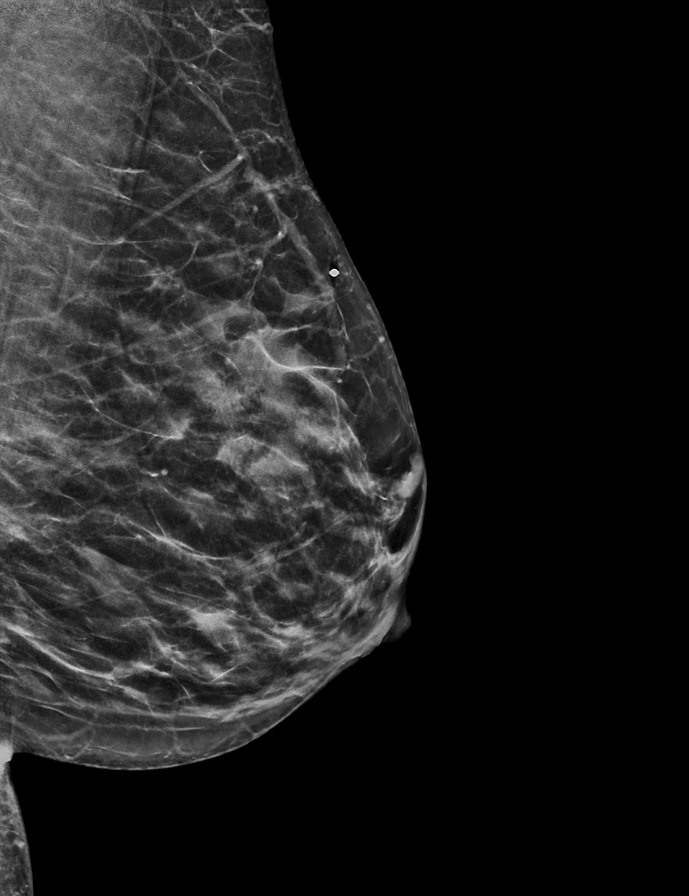

[L CC synth-2D (2 of 2)]
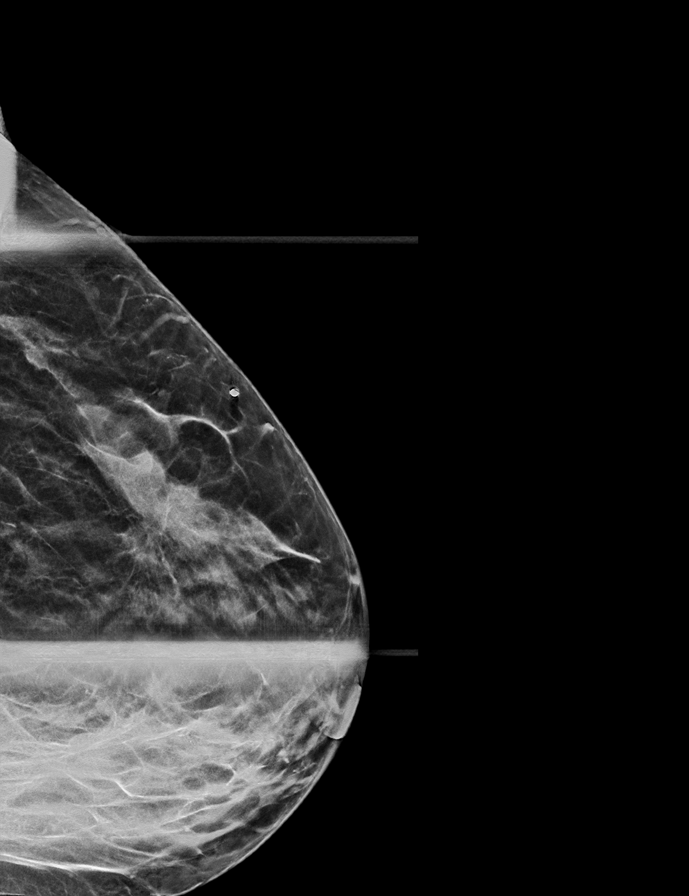

[R MLO synth-2D]
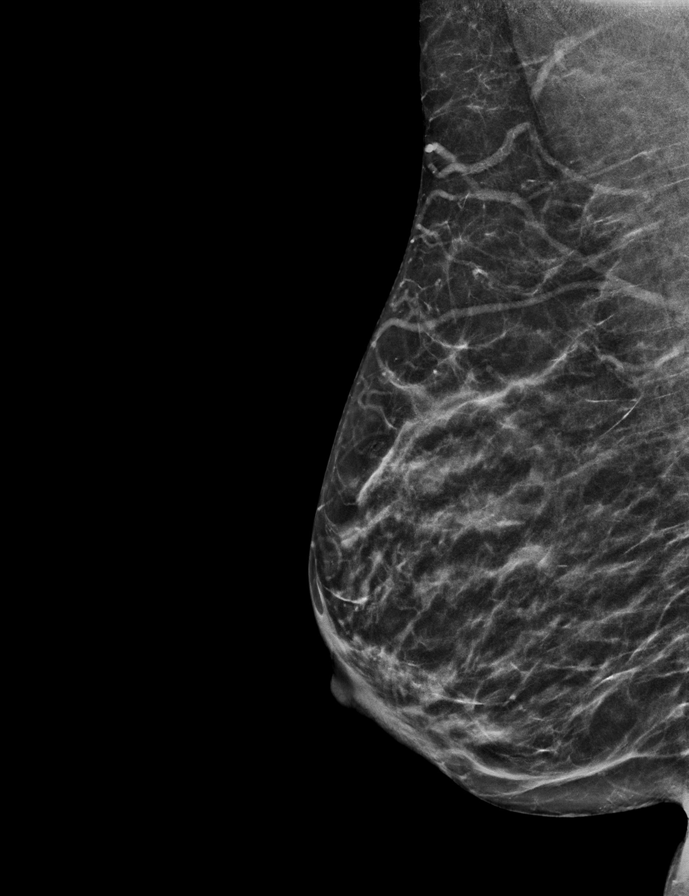

[L MLO synth-2D (2 of 2)]
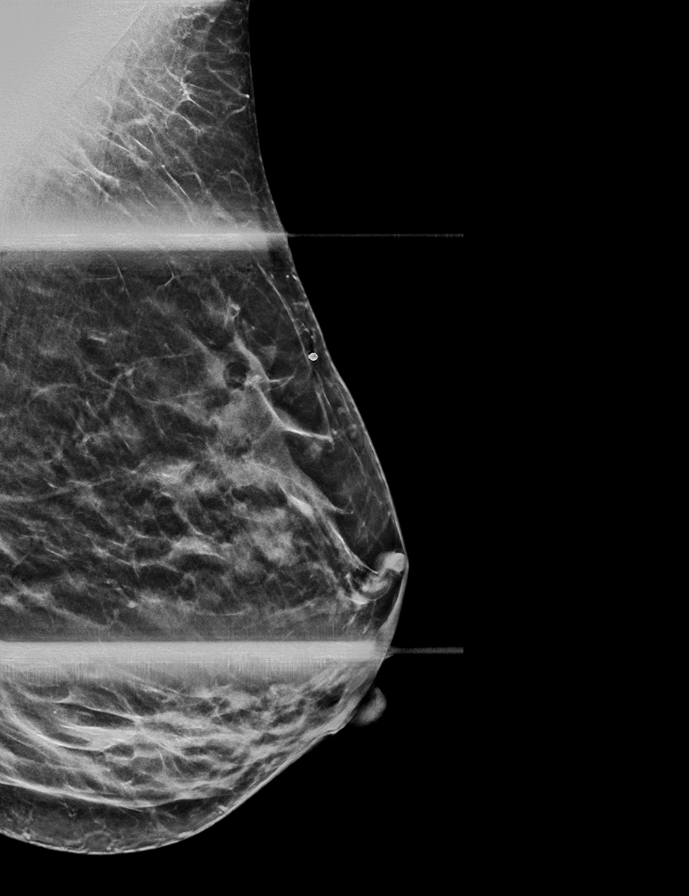

[L TAN synth-2D]
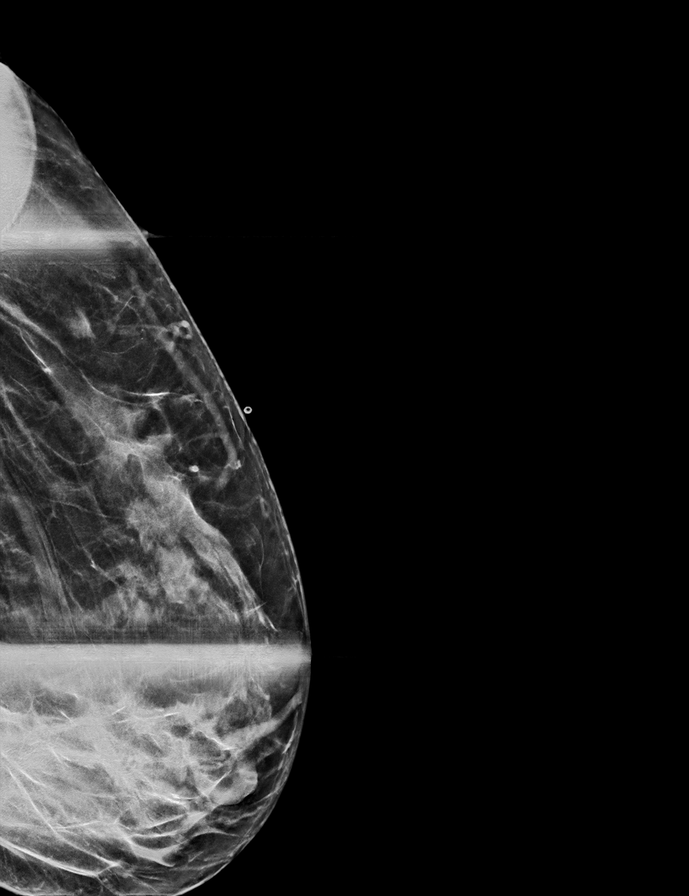

[L CC tomo · tomo slice 20/39.0]
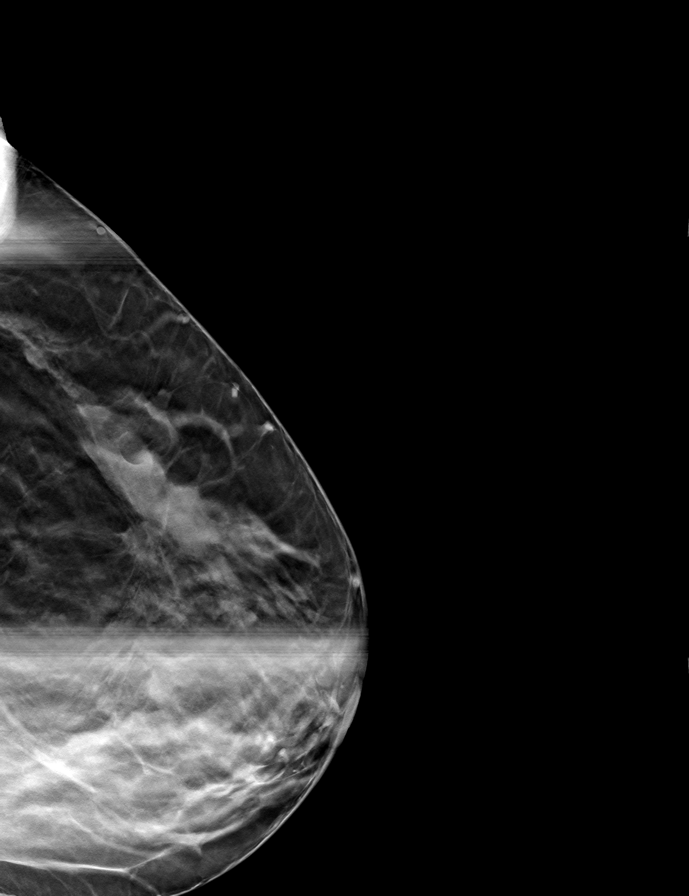

[8 of 40 positions shown; findings below may reference images not displayed]

ACR Breast Density Category c: The breast tissue is heterogeneously
dense, which may obscure small masses.
FINDINGS: Cc and MLO views of bilateral breasts, spot tangential view of left
breast, spot compression left cc and MLO views are submitted. There
are several ill-defined masses in the upper-outer quadrant left
breast. The right breast is negative.

Mammographic images were processed with CAD.

Targeted ultrasound is performed, showing multiple irregular
ill-defined masses throughout the left breast 1 o'clock and 2
o'clock positions from 3 cm from the nipple extending through 7 cm
from nipple. One of the masses correlates to the palpable
abnormality at left breast 2 o'clock 7 cm from nipple. The largest
of the masses measures 1.1 cm. Ultrasound of the left axilla
demonstrate multiple abnormal left axillary lymph nodes without
normal fatty hilum.
IMPRESSION: Highly suspicious findings.

RECOMMENDATION:
Ultrasound-guided core biopsies of 2 masses in the upper-outer
quadrant left breast the are furtherest apart and ultrasound biopsy
of abnormal left axillary lymph node.

I have discussed the findings and recommendations with the patient.
If applicable, a reminder letter will be sent to the patient
regarding the next appointment.

BI-RADS CATEGORY  5: Highly suggestive of malignancy.

## 2020-08-14 MED FILL — HUMIRA PEN CITRATE FREE 40 MG/0.4 ML: 28 days supply | Qty: 2 | Fill #2 | Status: AC

## 2020-08-14 MED FILL — HUMIRA PEN CITRATE FREE 40 MG/0.4 ML: SUBCUTANEOUS | 28 days supply | Qty: 2 | Fill #2

## 2020-09-09 NOTE — Unmapped (Signed)
[facility] Refill Coordination Note    Specialty Medication(s) to be Shipped:   Inflammatory Disorders: Humira    Other medication(s) to be shipped: No additional medications requested for fill at this time     Ann Riley, DOB: 26-Feb-1986  Phone: [phone] (home)       All above HIPAA information was verified with patient.     Was a Nurse, learning disability used for this call? No    Completed refill call assessment today to schedule patient's medication shipment from the [facility] [phone]).       Specialty medication(s) and dose(s) confirmed: Regimen is correct and unchanged.   Changes to medications: Ann Riley reports no changes at this time.  Changes to insurance: No  Questions for the pharmacist: No    Confirmed patient received Welcome Packet with first shipment. The patient will receive a drug information handout for each medication shipped and additional FDA Medication Guides as required.       DISEASE/MEDICATION-SPECIFIC INFORMATION        For patients on injectable medications: Patient currently has 0 doses left.  Next injection is scheduled for 12/7.    SPECIALTY MEDICATION ADHERENCE     Medication Adherence    Patient reported X missed doses in the last month: 0  Specialty Medication: Humira  Patient is on additional specialty medications: No  Patient is on more than two specialty medications: No  Any gaps in refill history greater than 2 weeks in the last 3 months: no  Demonstrates understanding of importance of adherence: yes  Informant: patient                Humira 40mg /0.5ml: Patient has 0 days of medication on hand       SHIPPING     Shipping address confirmed in Epic.     Delivery Scheduled: Yes, Expected medication delivery date: 12/2.     Medication will be delivered via Same Day Courier to the prescription address in Epic WAM.    Olga Millers   [facility] Specialty Technician

## 2020-09-18 MED FILL — HUMIRA PEN CITRATE FREE 40 MG/0.4 ML: SUBCUTANEOUS | 28 days supply | Qty: 2 | Fill #3

## 2020-09-18 MED FILL — HUMIRA PEN CITRATE FREE 40 MG/0.4 ML: 28 days supply | Qty: 2 | Fill #3 | Status: AC

## 2020-10-09 DIAGNOSIS — D869 Sarcoidosis, unspecified: Principal | ICD-10-CM

## 2020-10-09 MED ORDER — HUMIRA PEN CITRATE FREE 40 MG/0.4 ML
SUBCUTANEOUS | 3 refills | 28.00000 days | Status: CP
Start: 2020-10-09 — End: 2020-10-21
  Filled 2020-10-16: qty 2, 28d supply, fill #0

## 2020-10-09 NOTE — Unmapped (Signed)
[facility] Refill Coordination Note    Specialty Medication(s) to be Shipped:   Inflammatory Disorders: Humira    Other medication(s) to be shipped: No additional medications requested for fill at this time     Ann Riley, DOB: Mar 13, 1986  Phone: [phone] (home)       All above HIPAA information was verified with patient.     Was a Nurse, learning disability used for this call? No    Completed refill call assessment today to schedule patient's medication shipment from the [facility] [phone]).       Specialty medication(s) and dose(s) confirmed: Regimen is correct and unchanged.   Changes to medications: Ann Riley reports no changes at this time.  Changes to insurance: No  Questions for the pharmacist: No    Confirmed patient received Welcome Packet with first shipment. The patient will receive a drug information handout for each medication shipped and additional FDA Medication Guides as required.       DISEASE/MEDICATION-SPECIFIC INFORMATION        For patients on injectable medications: Patient currently has 0 doses left.  Next injection is scheduled for 1/3.    SPECIALTY MEDICATION ADHERENCE     Medication Adherence    Patient reported X missed doses in the last month: 0  Specialty Medication: Humira  Patient is on additional specialty medications: No  Patient is on more than two specialty medications: No  Any gaps in refill history greater than 2 weeks in the last 3 months: no  Demonstrates understanding of importance of adherence: yes  Informant: patient                Humira 40mg /0.50ml: Patient has 0 days of medication on hand      SHIPPING     Shipping address confirmed in Epic.     Delivery Scheduled: Yes, Expected medication delivery date: 12/30.  However, Rx request for refills was sent to the provider as there are none remaining.     Medication will be delivered via Same Day Courier to the prescription address in Epic WAM.    Olga Millers   [facility] Specialty Technician

## 2020-10-09 NOTE — Unmapped (Signed)
Adalimumab (Humira) refill  Last ov: 05/26/2020   Next ov: 12/01/2020     Labs:   AST   Date Value Ref Range Status   05/26/2020 25 <=34 U/L Final   04/30/2014 23 14 - 38 U/L Final     ALT   Date Value Ref Range Status   05/26/2020 19 10 - 49 U/L Final   04/30/2014 21 15 - 48 U/L Final     Creatinine   Date Value Ref Range Status   05/26/2020 0.60 0.60 - 0.80 mg/dL Final   16/07/9603 5.40 (L) 0.60 - 1.00 mg/dL Final     WBC   Date Value Ref Range Status   05/26/2020 5.2 3.5 - 10.5 10*9/L Final   04/30/2014 10.2 4.5 - 11.0 10*9/L Final     HGB   Date Value Ref Range Status   05/26/2020 13.6 12.0 - 15.5 g/dL Final   98/08/9146 82.9 12.0 - 16.0 g/dL Final     HCT   Date Value Ref Range Status   05/26/2020 41.4 35.0 - 44.0 % Final   04/30/2014 37.3 36.0 - 46.0 % Final     MCV   Date Value Ref Range Status   05/26/2020 93.8 82.0 - 98.0 fL Final   04/30/2014 90 80 - 100 fL Final     RDW   Date Value Ref Range Status   05/26/2020 14.7 12.0 - 15.0 % Final   04/30/2014 15.5 (H) 12.0 - 15.0 % Final     Platelet   Date Value Ref Range Status   05/26/2020 212 150 - 450 10*9/L Final   04/30/2014 208 150 - 440 10*9/L Final     Neutrophils %   Date Value Ref Range Status   05/26/2020 72.0 % Final     Lymphocytes %   Date Value Ref Range Status   05/26/2020 15.5 % Final     Monocytes %   Date Value Ref Range Status   05/26/2020 11.2 % Final     Eosinophils %   Date Value Ref Range Status   05/26/2020 0.5 % Final     Basophils %   Date Value Ref Range Status   05/26/2020 0.8 % Final

## 2020-10-16 MED FILL — HUMIRA PEN CITRATE FREE 40 MG/0.4 ML: 28 days supply | Qty: 2 | Fill #0 | Status: AC

## 2020-10-21 DIAGNOSIS — D869 Sarcoidosis, unspecified: Principal | ICD-10-CM

## 2020-10-21 MED ORDER — HUMIRA PEN CITRATE FREE 40 MG/0.4 ML
SUBCUTANEOUS | 3 refills | 28.00000 days | Status: CP
Start: 2020-10-21 — End: 2020-10-23

## 2020-10-21 NOTE — Unmapped (Signed)
The [facility] has received the prescription(s) for Humira. The triage team has completed the benefits investigation and has determined that the patient is NOT able to fill this medication at the [facility] due to insurance plan limitations. Please see additional information below and re-route the prescription to the preferred pharmacy. Thank you.    PA Required: Unable to Determine    Specialty Pharmacy Required:  [facility] - Phone: [phone]      *New insurance for 2022.

## 2020-10-21 NOTE — Unmapped (Signed)
This patient has been disenrolled from the [facility] specialty pharmacy services due to a pharmacy change resulting from insurance limitations. The insurance company requires the patient fill at [facility].    Ann Riley Isrrael Fluckiger  [facility] [employer] Specialty Pharmacist

## 2020-10-23 DIAGNOSIS — D869 Sarcoidosis, unspecified: Principal | ICD-10-CM

## 2020-10-23 MED ORDER — HUMIRA PEN CITRATE FREE 40 MG/0.4 ML
SUBCUTANEOUS | 3 refills | 28 days | Status: CP
Start: 2020-10-23 — End: ?

## 2020-10-29 DIAGNOSIS — D869 Sarcoidosis, unspecified: Principal | ICD-10-CM

## 2020-12-01 ENCOUNTER — Encounter
Admit: 2020-12-01 | Discharge: 2020-12-01 | Payer: PRIVATE HEALTH INSURANCE | Attending: Rheumatology | Primary: Rheumatology

## 2020-12-01 ENCOUNTER — Ambulatory Visit: Admit: 2020-12-01 | Discharge: 2020-12-01 | Payer: PRIVATE HEALTH INSURANCE

## 2020-12-01 DIAGNOSIS — R590 Localized enlarged lymph nodes: Principal | ICD-10-CM

## 2020-12-01 DIAGNOSIS — Z888 Allergy status to other drugs, medicaments and biological substances status: Principal | ICD-10-CM

## 2020-12-01 DIAGNOSIS — Z79899 Other long term (current) drug therapy: Principal | ICD-10-CM

## 2020-12-01 DIAGNOSIS — Z880 Allergy status to penicillin: Principal | ICD-10-CM

## 2020-12-01 DIAGNOSIS — D869 Sarcoidosis, unspecified: Principal | ICD-10-CM

## 2020-12-01 DIAGNOSIS — Z23 Encounter for immunization: Principal | ICD-10-CM

## 2020-12-01 DIAGNOSIS — Z881 Allergy status to other antibiotic agents status: Principal | ICD-10-CM

## 2021-11-04 DIAGNOSIS — D869 Sarcoidosis, unspecified: Principal | ICD-10-CM

## 2021-11-04 MED ORDER — HUMIRA PEN CITRATE FREE 40 MG/0.4 ML
SUBCUTANEOUS | 3 refills | 28.00000 days
Start: 2021-11-04 — End: ?

## 2021-11-05 MED ORDER — HUMIRA PEN CITRATE FREE 40 MG/0.4 ML
SUBCUTANEOUS | 3 refills | 28 days | Status: CP
Start: 2021-11-05 — End: ?

## 2021-11-12 DIAGNOSIS — D869 Sarcoidosis, unspecified: Principal | ICD-10-CM

## 2022-01-21 DIAGNOSIS — D869 Sarcoidosis, unspecified: Principal | ICD-10-CM

## 2022-01-21 MED ORDER — HUMIRA PEN CITRATE FREE 40 MG/0.4 ML
SUBCUTANEOUS | 3 refills | 28 days | Status: CP
Start: 2022-01-21 — End: ?

## 2022-02-17 DIAGNOSIS — D869 Sarcoidosis, unspecified: Principal | ICD-10-CM

## 2022-05-11 DIAGNOSIS — D860 Sarcoidosis of lung: Principal | ICD-10-CM

## 2022-11-20 ENCOUNTER — Encounter: Payer: Self-pay | Admitting: Emergency Medicine

## 2022-11-20 ENCOUNTER — Ambulatory Visit
Admission: EM | Admit: 2022-11-20 | Discharge: 2022-11-20 | Disposition: A | Discharge status: Home / Self Care | Payer: Medicaid Other | Attending: Family Medicine | Admitting: Family Medicine

## 2022-11-20 ENCOUNTER — Ambulatory Visit (INDEPENDENT_AMBULATORY_CARE_PROVIDER_SITE_OTHER): Payer: Medicaid Other

## 2022-11-20 DIAGNOSIS — R6884 Jaw pain: Secondary | ICD-10-CM

## 2022-11-20 DIAGNOSIS — M25532 Pain in left wrist: Secondary | ICD-10-CM

## 2022-11-20 MED ORDER — MELOXICAM 15 MG PO TABS: 15.0000 mg | ORAL_TABLET | Freq: Every day | ORAL | 0 refills | Status: DC | PRN
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
## 2022-11-20 NOTE — Discharge Instructions (Signed)
Rest, ice.  Medication as directed.  If jaw pain persists or worsens, go to the ER.

## 2022-12-06 DIAGNOSIS — D869 Sarcoidosis, unspecified: Principal | ICD-10-CM

## 2022-12-06 MED ORDER — HUMIRA PEN CITRATE FREE 40 MG/0.4 ML
SUBCUTANEOUS | 3 refills | 28 days
Start: 2022-12-06 — End: ?

## 2023-01-18 DIAGNOSIS — D869 Sarcoidosis, unspecified: Principal | ICD-10-CM

## 2023-03-30 ENCOUNTER — Ambulatory Visit: Admit: 2023-03-30 | Discharge: 2023-03-31 | Payer: PRIVATE HEALTH INSURANCE

## 2023-03-30 DIAGNOSIS — D869 Sarcoidosis, unspecified: Principal | ICD-10-CM

## 2023-03-30 MED ORDER — TRIAMCINOLONE ACETONIDE 0.1 % TOPICAL OINTMENT
Freq: Two times a day (BID) | TOPICAL | 11 refills | 0.00000 days | Status: CP
Start: 2023-03-30 — End: 2024-03-29

## 2023-05-18 ENCOUNTER — Ambulatory Visit
Admit: 2023-05-18 | Discharge: 2023-05-19 | Payer: PRIVATE HEALTH INSURANCE | Attending: Student in an Organized Health Care Education/Training Program | Primary: Student in an Organized Health Care Education/Training Program

## 2023-05-18 DIAGNOSIS — D869 Sarcoidosis, unspecified: Principal | ICD-10-CM

## 2023-07-08 ENCOUNTER — Ambulatory Visit: Admit: 2023-07-08 | Discharge: 2023-07-09 | Payer: PRIVATE HEALTH INSURANCE

## 2023-10-27 ENCOUNTER — Ambulatory Visit
Admit: 2023-10-27 | Discharge: 2023-10-28 | Payer: PRIVATE HEALTH INSURANCE | Attending: Student in an Organized Health Care Education/Training Program | Primary: Student in an Organized Health Care Education/Training Program

## 2023-10-27 DIAGNOSIS — D869 Sarcoidosis, unspecified: Principal | ICD-10-CM

## 2023-11-03 ENCOUNTER — Inpatient Hospital Stay: Admit: 2023-11-03 | Discharge: 2023-11-04 | Payer: PRIVATE HEALTH INSURANCE

## 2023-12-22 ENCOUNTER — Emergency Department

## 2023-12-22 ENCOUNTER — Other Ambulatory Visit: Payer: Self-pay

## 2023-12-22 DIAGNOSIS — M7918 Myalgia, other site: Secondary | ICD-10-CM | POA: Insufficient documentation

## 2023-12-22 DIAGNOSIS — Y92410 Unspecified street and highway as the place of occurrence of the external cause: Secondary | ICD-10-CM | POA: Insufficient documentation
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
## 2023-12-23 ENCOUNTER — Emergency Department
Admission: EM | Admit: 2023-12-23 | Discharge: 2023-12-23 | Disposition: A | Discharge status: Home / Self Care | Attending: Emergency Medicine | Admitting: Emergency Medicine

## 2023-12-23 DIAGNOSIS — M7918 Myalgia, other site: Secondary | ICD-10-CM

## 2023-12-23 MED ORDER — ACETAMINOPHEN 500 MG PO TABS
1000.0000 mg | ORAL_TABLET | Freq: Once | ORAL | Status: AC
  Administered 2023-12-23: 1000 mg via ORAL
  Filled 2023-12-23: qty 2

## 2023-12-23 MED ORDER — IBUPROFEN 600 MG PO TABS
600.0000 mg | ORAL_TABLET | Freq: Once | ORAL | Status: AC
  Administered 2023-12-23: 600 mg via ORAL
  Filled 2023-12-23: qty 1
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
## 2023-12-23 NOTE — Discharge Instructions (Signed)
 Take acetaminophen 650 mg and ibuprofen 400 mg every 6 hours for pain.  Take with food.  Thank you for choosing [place] for your health care today!  Please see your primary doctor this week for a follow up appointment.   If you have any new, worsening, or unexpected symptoms call your doctor right away or come back to the emergency department for reevaluation.  It was my pleasure to care for you today.   Daneil Dan [place] Charon, MD
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
## 2024-02-22 ENCOUNTER — Encounter: Payer: Self-pay | Admitting: Nurse Practitioner

## 2024-02-23 ENCOUNTER — Other Ambulatory Visit: Payer: Self-pay | Admitting: Nurse Practitioner

## 2024-02-23 DIAGNOSIS — D869 Sarcoidosis, unspecified: Secondary | ICD-10-CM

## 2024-02-23 DIAGNOSIS — N63 Unspecified lump in unspecified breast: Secondary | ICD-10-CM

## 2024-03-08 ENCOUNTER — Ambulatory Visit
Admission: RE | Admit: 2024-03-08 | Discharge: 2024-03-08 | Disposition: A | Discharge status: Home / Self Care | Source: Ambulatory Visit | Attending: Nurse Practitioner | Admitting: Nurse Practitioner

## 2024-03-08 DIAGNOSIS — N63 Unspecified lump in unspecified breast: Secondary | ICD-10-CM

## 2024-03-08 DIAGNOSIS — D869 Sarcoidosis, unspecified: Secondary | ICD-10-CM | POA: Insufficient documentation

## 2024-03-13 ENCOUNTER — Other Ambulatory Visit: Payer: Self-pay | Admitting: Nurse Practitioner

## 2024-03-13 DIAGNOSIS — R928 Other abnormal and inconclusive findings on diagnostic imaging of breast: Secondary | ICD-10-CM

## 2024-03-27 ENCOUNTER — Other Ambulatory Visit

## 2024-03-27 ENCOUNTER — Encounter

## 2024-03-30 ENCOUNTER — Ambulatory Visit
Admission: RE | Admit: 2024-03-30 | Discharge: 2024-03-30 | Disposition: A | Discharge status: Home / Self Care | Source: Ambulatory Visit | Attending: Nurse Practitioner | Admitting: Nurse Practitioner

## 2024-03-30 DIAGNOSIS — R928 Other abnormal and inconclusive findings on diagnostic imaging of breast: Secondary | ICD-10-CM | POA: Insufficient documentation

## 2024-03-30 HISTORY — PX: BREAST BIOPSY: SHX20

## 2024-03-30 MED ORDER — LIDOCAINE-EPINEPHRINE 1 %-1:100000 IJ SOLN
8.0000 mL | Freq: Once | INTRAMUSCULAR | Status: AC
  Administered 2024-03-30: 8 mL
  Filled 2024-03-30: qty 8

## 2024-03-30 MED ORDER — LIDOCAINE 1 % OPTIME INJ - NO CHARGE
2.0000 mL | Freq: Once | INTRAMUSCULAR | Status: AC
  Filled 2024-03-30: qty 2

## 2024-04-03 LAB — SURGICAL PATHOLOGY

## 2024-04-26 ENCOUNTER — Ambulatory Visit: Admit: 2024-04-26 | Discharge: 2024-04-26 | Payer: Medicaid (Managed Care)

## 2024-04-26 ENCOUNTER — Ambulatory Visit
Admit: 2024-04-26 | Discharge: 2024-04-26 | Payer: Medicaid (Managed Care) | Attending: Student in an Organized Health Care Education/Training Program | Primary: Student in an Organized Health Care Education/Training Program

## 2024-04-26 ENCOUNTER — Encounter
Admit: 2024-04-26 | Discharge: 2024-04-26 | Payer: Medicaid (Managed Care) | Attending: Student in an Organized Health Care Education/Training Program | Primary: Student in an Organized Health Care Education/Training Program

## 2024-04-26 DIAGNOSIS — D869 Sarcoidosis, unspecified: Principal | ICD-10-CM

## 2024-06-30 ENCOUNTER — Emergency Department
Admission: EM | Admit: 2024-06-30 | Discharge: 2024-06-30 | Disposition: A | Discharge status: Home / Self Care | Payer: Self-pay | Attending: Emergency Medicine | Admitting: Emergency Medicine

## 2024-06-30 ENCOUNTER — Emergency Department: Payer: Self-pay

## 2024-06-30 ENCOUNTER — Other Ambulatory Visit: Payer: Self-pay

## 2024-06-30 DIAGNOSIS — N750 Cyst of Bartholin's gland: Secondary | ICD-10-CM

## 2024-06-30 DIAGNOSIS — N764 Abscess of vulva: Secondary | ICD-10-CM | POA: Insufficient documentation

## 2024-06-30 DIAGNOSIS — N751 Abscess of Bartholin's gland: Secondary | ICD-10-CM | POA: Insufficient documentation

## 2024-06-30 MED ORDER — HYDROCODONE-ACETAMINOPHEN 5-325 MG PO TABS: 1.0000 | ORAL_TABLET | Freq: Four times a day (QID) | ORAL | 0 refills | Status: DC | PRN

## 2024-06-30 MED ORDER — LIDOCAINE HCL (PF) 1 % IJ SOLN: 5.0000 mL | Freq: Once | INTRAMUSCULAR | Status: AC

## 2024-06-30 MED ORDER — KETOROLAC TROMETHAMINE 30 MG/ML IJ SOLN
60.0000 mg | Freq: Once | INTRAMUSCULAR | Status: AC
  Administered 2024-06-30: 60 mg via INTRAMUSCULAR
  Filled 2024-06-30: qty 2

## 2024-06-30 MED ORDER — LIDOCAINE HCL (PF) 1 % IJ SOLN
INTRAMUSCULAR | Status: AC
  Administered 2024-06-30: 5 mL
  Filled 2024-06-30: qty 5

## 2024-06-30 MED ORDER — ACETAMINOPHEN 500 MG PO TABS: 1000.0000 mg | ORAL_TABLET | Freq: Four times a day (QID) | ORAL | 2 refills | Status: AC | PRN

## 2024-06-30 MED ORDER — IBUPROFEN 800 MG PO TABS: 800.0000 mg | ORAL_TABLET | Freq: Three times a day (TID) | ORAL | 0 refills | Status: AC | PRN

## 2024-06-30 MED ORDER — SULFAMETHOXAZOLE-TRIMETHOPRIM 800-160 MG PO TABS
1.0000 | ORAL_TABLET | Freq: Once | ORAL | Status: AC
  Administered 2024-06-30: 1 via ORAL
  Filled 2024-06-30: qty 1

## 2024-06-30 MED ORDER — SULFAMETHOXAZOLE-TRIMETHOPRIM 400-80 MG PO TABS
1.0000 | ORAL_TABLET | Freq: Two times a day (BID) | ORAL | 0 refills | Status: AC
Start: 2024-06-30 — End: 2024-07-07

## 2024-06-30 MED ORDER — LIDOCAINE HCL (PF) 1 % IJ SOLN
5.0000 mL | Freq: Once | INTRAMUSCULAR | Status: AC
  Administered 2024-06-30: 5 mL
  Filled 2024-06-30: qty 5

## 2024-06-30 MED ORDER — HYDROCODONE-ACETAMINOPHEN 5-325 MG PO TABS: 1.0000 | ORAL_TABLET | Freq: Four times a day (QID) | ORAL | 0 refills | Status: AC | PRN

## 2024-06-30 NOTE — ED Triage Notes (Addendum)
 Pt c/o right labial pain and swelling x2-3 days ago. Pt denies bleeding/discharge. Pt AOX4, tearful, states she's unable to sit comfortably. 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

## 2024-06-30 NOTE — Discharge Instructions (Addendum)
 You have been diagnosed with Bartholin's cyst, abscess of labia majora.  Please take Bactrim  1 tablet by mouth every 12 hours for 7 days.  Please take acetaminophen  2 tablets by mouth every 6 hours.  Please alternate with ibuprofen  1 tablet by mouth every 8 hours.  You can take Norco 1 tablet by mouth every 6 hours.  Please call on Monday in the morning OB/GYN office to have an appointment on Monday for a follow-up.  Please come back to ED or go to your PCP if you have any symptoms symptoms worsen

## 2024-06-30 NOTE — Consult Note (Signed)
 Consult History and Physical   SERVICE: Gynecology   Patient Name: Natasha Dominguez Patient MRN:   [id number]  CC: Right labial pain  HPI: Natasha Dominguez is a 38 y.o. F presenting with right labial abscess. A collection of clear fluid was drained from the labia by the ER provider prior to consulting me, but her pain did not diminish as expected.  Therefore, an ultrasound was ordered which found a 5 cm abscess.  She has had a similar abscess 1 time in the past which resolved on its own.  She denies dysuria, vaginal discharge or bleeding, constipation.  Her menstrual cycle is predictable and normal and unrelated to the symptoms.   Review of Systems: positives in bold GEN:   fevers, chills, weight changes, appetite changes, fatigue, night sweats HEENT:  HA, vision changes, hearing loss, congestion, rhinorrhea, sinus pressure, dysphagia CV:   CP, palpitations PULM:  SOB, cough GI:  Labial pain, N/V/D/C GU:  dysuria, urgency, frequency MSK:  arthralgias, myalgias, back pain, swelling SKIN:  rashes, color changes, pallor NEURO:  numbness, weakness, tingling, seizures, dizziness, tremors PSYCH:  depression, anxiety, behavioral problems, confusion  HEME/LYMPH:  easy bruising or bleeding ENDO:  heat/cold intolerance  Past Obstetrical History: OB History   No obstetric history on file.     Past Gynecologic History: Patient's last menstrual period was 06/17/2024 (exact date).   Past Medical History: History reviewed. No pertinent past medical history.  Past Surgical History:   Past Surgical History:  Procedure Laterality Date   BREAST BIOPSY Right 03/30/2024   US  RT BREAST BX W LOC DEV 1ST LESION IMG BX SPEC US  GUIDE 03/30/2024 [facility]   TUBAL LIGATION      Family History:  family history includes Diabetes in her father and mother; Hypertension in her mother.  Social History:  Social History   Socioeconomic History   Marital status: Married    Spouse name: Not on file    Number of children: Not on file   Years of education: Not on file   Highest education level: Not on file  Occupational History   Not on file  Tobacco Use   Smoking status: Never   Smokeless tobacco: Never  Vaping Use   Vaping status: Never Used  Substance and Sexual Activity   Alcohol use: Yes    Alcohol/week: 2.0 standard drinks of alcohol    Types: 2 Standard drinks or equivalent per week   Drug use: No   Sexual activity: Not on file  Other Topics Concern   Not on file  Social History Narrative   Not on file   Social Drivers of Health   Financial Resource Strain: Medium Risk (02/22/2024)   Received from [facility]   Overall Financial Resource Strain (CARDIA)    Difficulty of Paying Living Expenses: Somewhat hard  Food Insecurity: Food Insecurity Present (02/22/2024)   Received from [facility]   Hunger Vital Sign    Within the past 12 months, you worried that your food would run out before you got the money to buy more.: Sometimes true    Within the past 12 months, the food you bought just didn't last and you didn't have money to get more.: Sometimes true  Transportation Needs: No Transportation Needs (02/22/2024)   Received from [facility] - Transportation    In the past 12 months, has lack of transportation kept you from medical appointments or from getting medications?: No  Lack of Transportation (Non-Medical): No  Physical Activity: Not on file  Stress: Not on file  Social Connections: Not on file  Intimate Partner Violence: Not At Risk (04/26/2024)   Received from [facility]   Humiliation, Afraid, Rape, and Kick questionnaire    Within the last year, have you been afraid of your partner or ex-partner?: No    Within the last year, have you been humiliated or emotionally abused in other ways by your partner or ex-partner?: No    Within the last year, have you been kicked, hit, slapped, or otherwise  physically hurt by your partner or ex-partner?: No    Within the last year, have you been raped or forced to have any kind of sexual activity by your partner or ex-partner?: No    Home Medications:  Medications reconciled in EPIC  No current facility-administered medications on file prior to encounter.   Current Outpatient Medications on File Prior to Encounter  Medication Sig Dispense Refill   meloxicam  (MOBIC ) 15 MG tablet Take 1 tablet (15 mg total) by mouth daily as needed for pain. 30 tablet 0    Allergies:  Allergies  Allergen Reactions   Amoxicillin Hives   Penicillins    Citrus Itching   Nickel Rash    Physical Exam:  Temp:  [98.4 F (36.9 C)] 98.4 F (36.9 C) (09/13 1553) Pulse Rate:  [98-101] 101 (09/13 1924) Resp:  [18] 18 (09/13 1924) BP: (139-164)/(96-115) 139/96 (09/13 1924) SpO2:  [99 %-100 %] 99 % (09/13 1924)   General Appearance:  Well developed, well nourished, no acute distress, alert and oriented x3 HEENT:  Normocephalic atraumatic, extraocular movements intact, moist mucous membranes  Extremities:  Full range of motion, no pedal edema, no tenderness Skin:  normal coloration and turgor, no rashes, no suspicious skin lesions noted  Neurologic:  Cranial nerves 2-12 grossly intact, normal muscle tone, strength 5/5 all four extremities Psychiatric:  Normal mood and affect, appropriate, no AH/VH Pelvic:  NEFG,  right vulvar mass encompassing labia majora and labia minora, and extending to the hymenal ring internally with no overlying erythema and edema, normal vaginal mucosa   Other Imaging: US  Pelvis Limited Result Date: 06/30/2024 CLINICAL DATA:  Bartholin cyst. EXAM: LIMITED ULTRASOUND OF PELVIS TECHNIQUE: Limited transabdominal ultrasound examination of the pelvis was performed. COMPARISON:  None Available. FINDINGS: Targeted sonographic images of the inferior right labia in the region of the clinical concern was performed. There is a 3.8 x 1.8 x 3.0 cm  complex collection with thick wall and surrounding inflammation and hyperemia consistent with an infected collection/abscess. No internal vascularity noted. IMPRESSION: Infected collection/abscess. Electronically Signed   By: Vanetta Chou M.D.   On: 06/30/2024 18:22   After speaking with patient and her partner at the bedside, we talked about going to the operating room for good anesthesia or trying for bedside I&D.  She opted for repeat bedside I&D.  Procedure: The right sided labial mucosa was prepped with 3 iodine swabs.  An area was chosen just proximal to the hymenal ring where an area of fluctuance was noted.  8 cc of 1% lidocaine  with epi was infused over the proposed incision area.  Patient tolerated this well.  An 11 blade was used to create a 4 mm vertical incision, with immediate extravasation of pus under pressure.  The drainage was collected for culture. All loculations were broken up with a tonsil until the fluid ran clear or as bright red blood, and a stab  incision was used laterally to be certain that all areas were drained.  Iodoform packing was placed in this area to help with drainage, with plans for removal in the office in 3 days.  The area was covered with gauze and a maxi pad.    Assessment / Plan:   Natasha Dominguez is a 38 y.o. F who presents with symptomatic right labia abscess, without concern for intra-abscess necrosis and surrounding tissue involvement.   I&D was successful.  Plan for at home Bactrim .  Will give the patient ibuprofen  and Tylenol  with oxycodone as needed.  Will follow-up on cultures.  Will see her in the office as an ER follow-up to remove the packing if it remains.  Conservative measures for pain management also discussed.  Thank you for the opportunity to be involved with this pt's care.

## 2024-06-30 NOTE — ED Provider Notes (Cosign Needed Addendum)
 [facility] Provider Note    Event Date/Time   First MD Initiated Contact with Patient 06/30/24 1616     (approximate)   History   Groin Swelling    HPI  Natasha Dominguez is a 38 y.o. female    with a past medical history of sarcoidosis, knee pain, vitamin D deficiency left breast lump,  who presents to the ED complaining of right labia majora pain. According to the patient, she noticed  swelling in her right labia majora, is tender to palpation, tender to walk.  Patient denies fever, chills.  Patient had a previous episode that resolved by itself. Patient is here with her family.  Patient is allergic to penicillin, amoxicillin.    Patient Active Problem List   Diagnosis Date Noted   Irregular menses 05/07/2015   Xerosis of skin 05/07/2015    ROS: Patient currently denies any vision changes, tinnitus, difficulty speaking, facial droop, sore throat, chest pain, shortness of breath, abdominal pain, nausea/vomiting/diarrhea, dysuria, or weakness/numbness/paresthesias in any extremity   Physical Exam   Triage Vital Signs: ED Triage Vitals  Encounter Vitals Group     BP 06/30/24 1553 (!) 164/115     Girls Systolic BP Percentile --      Girls Diastolic BP Percentile --      Boys Systolic BP Percentile --      Boys Diastolic BP Percentile --      Pulse Rate 06/30/24 1553 98     Resp 06/30/24 1553 18     Temp 06/30/24 1553 98.4 F (36.9 C)     Temp Source 06/30/24 1553 Oral     SpO2 06/30/24 1553 100 %     Weight --      Height --      Head Circumference --      Peak Flow --      Pain Score 06/30/24 1554 10     Pain Loc --      Pain Education --      Exclude from Growth Chart --     Most recent vital signs: Vitals:   06/30/24 1646 06/30/24 1924  BP:  (!) 139/96  Pulse:  (!) 101  Resp:  18  Temp:    SpO2: 100% 99%     Physical Exam Vitals and nursing note reviewed.  During triage patient was hypertensive  Constitutional:      General:  Awake and alert. No acute distress.    Appearance: Normal appearance. The patient is normal weight.      Able to speak in complete sentences without cough or dyspnea  HENT:     Head: Normocephalic and atraumatic.     Mouth: Mucous membranes are moist.  Eyes:     General: PERRL. Normal EOMs          Conjunctiva/sclera: Conjunctivae normal.  Nose No congestion/rhinorrhea  CV:                  Good peripheral perfusion.  Regular rate and rhythm  Resp:               Normal effort.  Equal breath sounds bilaterally.  Abd:                 No distention.  Soft, nontender.  No rebound or  guarding.  GU : Right labia majora erythematosus, tender to palpation, no warmness.  Fluctuation.  Musculoskeletal:        General: No swelling. Normal  range of motion.  Skin:    General: Skin is warm and dry.     Capillary Refill: Capillary refill takes less than 2 seconds.     Findings: No rash.  Neurological:     Mental Status: The patient is awake and alert. MAE spontaneously. No gross focal neurologic deficits are appreciated.  Psychiatric Mood and affect are normal. Speech and behavior are normal.  ED Results / Procedures / Treatments   Labs (all labs ordered are listed, but only abnormal results are displayed) Labs Reviewed  AEROBIC CULTURE W GRAM STAIN (SUPERFICIAL SPECIMEN)     EKG    PROCEDURES:  Critical Care performed:   .Incision and Drainage  Date/Time: 06/30/2024 5:39 PM  Performed by: Janit Kast, PA-C Authorized by: Janit Kast, PA-C   Consent:    Consent obtained:  Verbal   Consent given by:  Patient   Risks discussed:  Pain Universal protocol:    Procedure explained and questions answered to patient or proxy's satisfaction: yes     Patient identity confirmed:  Verbally with patient Location:    Type:  Bartholin cyst   Location:  Anogenital   Anogenital location:  Bartholin's gland Pre-procedure details:    Skin preparation:  Povidone-iodine Sedation:     Sedation type:  None Anesthesia:    Anesthesia method:  Local infiltration   Local anesthetic:  Lidocaine  1% WITH epi Procedure type:    Complexity:  Simple Procedure details:    Ultrasound guidance: no     Needle aspiration: no     Incision types:  Stab incision   Incision depth:  Dermal   Wound management:  Probed and deloculated and extensive cleaning   Drainage:  Serosanguinous   Drainage amount:  Moderate   Wound treatment:  Wound left open   Packing materials:  None Post-procedure details:    Procedure completion:  Tolerated well, no immediate complications Comments:     After draining the cyst, there is an induration.  Ordered soft tissue ultrasound    MEDICATIONS ORDERED IN ED: Medications  sulfamethoxazole -trimethoprim  (BACTRIM  DS) 800-160 MG per tablet 1 tablet (has no administration in time range)  lidocaine  (PF) (XYLOCAINE ) 1 % injection 5 mL (5 mLs Other Given 06/30/24 1700)  ketorolac  (TORADOL ) 30 MG/ML injection 60 mg (60 mg Intramuscular Given 06/30/24 1701)  lidocaine  (PF) (XYLOCAINE ) 1 % injection 5 mL (5 mLs Other Given by Other 06/30/24 2200)   Clinical Course as of 06/30/24 2213  Sat Jun 30, 2024  1830 US  Pelvis Limited Infected collection/abscess. [AE]  1848 After draining Bartholin cyst, found induration, ordered ultrasound the report that abscess approximately 3.8  x 1.8 x3 with thick walls.  I did consult OB/GYN.  Midwife called back and is going to consult with a physician [AE]    Clinical Course User Index [AE] Janit Kast, PA-C    IMPRESSION / MDM / ASSESSMENT AND PLAN / ED COURSE  I reviewed the triage vital signs and the nursing notes.  Differential diagnosis includes, but is not limited to, Bartholin cyst, abscess, unlikely foreign body  Patient's presentation is most consistent with acute, uncomplicated illness.   Natasha Dominguez is a 38 y.o., female presents today with history of mass on her right labia majora, tender to palpation.   Patient endorses painful to walk or sit.  Patient denies fever, chills. Physical exam there is edema in the right labia majora, tenderness to palpation, fluctuation. Plan Toradol  IM I&D US  pelvic limited Consult OB/GYN Dr. Verdon  came to see the patient, she did the I&D.  I did assist her during the procedure.  Ordered culture.  Advised to give acetaminophen , ibuprofen , Norco and Bactrim .  Follow-up with her on Monday. Discharge Patient's diagnosis is consistent with Bartholin cyst, abscess. I independently reviewed and interpreted imaging and agree with radiologists findings. I did review the patient's allergies and medications, patient is allergic to penicillin and amoxicillin.The patient is in stable and satisfactory condition for discharge home  Patient will be discharged home with prescriptions for Bactrim , acetaminophen , ibuprofen , Norco. Patient is to follow up with Dr. Verdon on Monday as needed or otherwise directed. Patient is given ED precautions to return to the ED for any worsening or new symptoms.  Note for work was provided Discussed plan of care with patient, answered all of patient's questions, Patient agreeable to plan of care. Advised patient to take medications according to the instructions on the label. Discussed possible side effects of new medications. Patient verbalized understanding.   FINAL CLINICAL IMPRESSION(S) / ED DIAGNOSES   Final diagnoses:  Bartholin's cyst  Abscess of labia majora     Rx / DC Orders   ED Discharge Orders          Ordered    ibuprofen  (ADVIL ) 800 MG tablet  Every 8 hours PRN        06/30/24 2211    acetaminophen  (TYLENOL ) 500 MG tablet  Every 6 hours PRN        06/30/24 2211    sulfamethoxazole -trimethoprim  (BACTRIM ) 400-80 MG tablet  2 times daily        06/30/24 2211    HYDROcodone -acetaminophen  (NORCO/VICODIN) 5-325 MG tablet  Every 6 hours PRN        06/30/24 2211             Note:  This document was prepared using  Dragon voice recognition software and may include unintentional dictation errors.   Janit Kast, PA-C 06/30/24 2213    Janit Kast, PA-C 06/30/24 2215    Ernest Ronal BRAVO, MD 07/01/24 1235
